# Patient Record
Sex: Male | Born: 1955 | Race: White | Hispanic: No | Marital: Married | Smoking: Former smoker
Health system: Southern US, Community
[De-identification: ages and names within clinical notes are randomized; demographics above are authoritative.]

## PROBLEM LIST (undated history)

## (undated) DIAGNOSIS — I255 Ischemic cardiomyopathy: Secondary | ICD-10-CM

## (undated) DIAGNOSIS — I4891 Unspecified atrial fibrillation: Secondary | ICD-10-CM

## (undated) DIAGNOSIS — I2109 ST elevation (STEMI) myocardial infarction involving other coronary artery of anterior wall: Secondary | ICD-10-CM

## (undated) DIAGNOSIS — I236 Thrombosis of atrium, auricular appendage, and ventricle as current complications following acute myocardial infarction: Secondary | ICD-10-CM

## (undated) DIAGNOSIS — I2510 Atherosclerotic heart disease of native coronary artery without angina pectoris: Secondary | ICD-10-CM

## (undated) DIAGNOSIS — Z95810 Presence of automatic (implantable) cardiac defibrillator: Secondary | ICD-10-CM

## (undated) HISTORY — DX: ST elevation (STEMI) myocardial infarction involving other coronary artery of anterior wall: I21.09

## (undated) HISTORY — PX: BACK SURGERY: SHX140

## (undated) HISTORY — DX: Presence of automatic (implantable) cardiac defibrillator: Z95.810

## (undated) HISTORY — PX: SHOULDER SURGERY: SHX246

## (undated) HISTORY — PX: VASECTOMY: SHX75

## (undated) HISTORY — PX: KNEE SURGERY: SHX244

## (undated) HISTORY — DX: Thrombosis of atrium, auricular appendage, and ventricle as current complications following acute myocardial infarction: I23.6

## (undated) HISTORY — DX: Ischemic cardiomyopathy: I25.5

## (undated) HISTORY — DX: Unspecified atrial fibrillation: I48.91

## (undated) HISTORY — PX: CARDIAC DEFIBRILLATOR PLACEMENT: SHX171

## (undated) HISTORY — DX: Atherosclerotic heart disease of native coronary artery without angina pectoris: I25.10

---

## 2009-01-22 ENCOUNTER — Ambulatory Visit: Payer: Self-pay | Admitting: Cardiovascular Disease

## 2009-01-22 ENCOUNTER — Inpatient Hospital Stay (HOSPITAL_COMMUNITY): Admission: RE | Admit: 2009-01-22 | Discharge: 2009-01-25 | Payer: Self-pay | Admitting: Cardiology

## 2009-01-23 ENCOUNTER — Encounter: Payer: Self-pay | Admitting: Cardiovascular Disease

## 2009-01-29 ENCOUNTER — Ambulatory Visit: Payer: Self-pay | Admitting: Internal Medicine

## 2009-01-29 ENCOUNTER — Encounter: Payer: Self-pay | Admitting: Cardiovascular Disease

## 2009-01-29 LAB — CONVERTED CEMR LAB

## 2009-02-01 ENCOUNTER — Ambulatory Visit: Payer: Self-pay | Admitting: Internal Medicine

## 2009-02-01 LAB — CONVERTED CEMR LAB: POC INR: 2.7

## 2009-02-06 DIAGNOSIS — E785 Hyperlipidemia, unspecified: Secondary | ICD-10-CM

## 2009-02-06 DIAGNOSIS — I4891 Unspecified atrial fibrillation: Secondary | ICD-10-CM | POA: Insufficient documentation

## 2009-02-06 DIAGNOSIS — F17200 Nicotine dependence, unspecified, uncomplicated: Secondary | ICD-10-CM

## 2009-02-06 DIAGNOSIS — I2510 Atherosclerotic heart disease of native coronary artery without angina pectoris: Secondary | ICD-10-CM

## 2009-02-06 DIAGNOSIS — I2109 ST elevation (STEMI) myocardial infarction involving other coronary artery of anterior wall: Secondary | ICD-10-CM

## 2009-02-06 DIAGNOSIS — I2589 Other forms of chronic ischemic heart disease: Secondary | ICD-10-CM

## 2009-02-06 HISTORY — DX: Hyperlipidemia, unspecified: E78.5

## 2009-02-06 HISTORY — DX: Other forms of chronic ischemic heart disease: I25.89

## 2009-02-07 ENCOUNTER — Ambulatory Visit: Payer: Self-pay | Admitting: Cardiovascular Disease

## 2009-02-07 DIAGNOSIS — I238 Other current complications following acute myocardial infarction: Secondary | ICD-10-CM

## 2009-02-07 HISTORY — DX: Other current complications following acute myocardial infarction: I23.8

## 2009-02-07 LAB — CONVERTED CEMR LAB: POC INR: 3.7

## 2009-02-19 ENCOUNTER — Ambulatory Visit: Payer: Self-pay | Admitting: Cardiology

## 2009-03-01 ENCOUNTER — Ambulatory Visit: Payer: Self-pay | Admitting: Cardiology

## 2009-03-22 ENCOUNTER — Ambulatory Visit: Payer: Self-pay | Admitting: Cardiovascular Disease

## 2009-03-22 LAB — CONVERTED CEMR LAB: POC INR: 2

## 2009-04-12 ENCOUNTER — Ambulatory Visit: Payer: Self-pay | Admitting: Cardiovascular Disease

## 2009-04-12 LAB — CONVERTED CEMR LAB: POC INR: 2

## 2009-04-19 ENCOUNTER — Encounter: Payer: Self-pay | Admitting: Cardiovascular Disease

## 2009-04-23 ENCOUNTER — Ambulatory Visit: Payer: Self-pay

## 2009-04-23 ENCOUNTER — Ambulatory Visit: Payer: Self-pay | Admitting: Internal Medicine

## 2009-04-23 ENCOUNTER — Encounter: Payer: Self-pay | Admitting: Cardiovascular Disease

## 2009-04-23 ENCOUNTER — Ambulatory Visit (HOSPITAL_COMMUNITY): Admission: RE | Admit: 2009-04-23 | Discharge: 2009-04-23 | Payer: Self-pay | Admitting: Cardiovascular Disease

## 2009-05-03 ENCOUNTER — Ambulatory Visit: Payer: Self-pay | Admitting: Cardiology

## 2009-05-17 ENCOUNTER — Ambulatory Visit: Payer: Self-pay | Admitting: Cardiology

## 2009-06-07 ENCOUNTER — Ambulatory Visit: Payer: Self-pay | Admitting: Internal Medicine

## 2009-06-08 ENCOUNTER — Encounter (INDEPENDENT_AMBULATORY_CARE_PROVIDER_SITE_OTHER): Payer: Self-pay | Admitting: *Deleted

## 2009-07-05 ENCOUNTER — Ambulatory Visit: Payer: Self-pay | Admitting: Internal Medicine

## 2009-07-05 ENCOUNTER — Ambulatory Visit: Payer: Self-pay | Admitting: Cardiovascular Disease

## 2009-07-05 LAB — CONVERTED CEMR LAB
CO2: 28 meq/L (ref 19–32)
Chloride: 104 meq/L (ref 96–112)
Creatinine, Ser: 0.8 mg/dL (ref 0.4–1.5)
POC INR: 3.1
Sodium: 138 meq/L (ref 135–145)

## 2009-07-13 ENCOUNTER — Telehealth (INDEPENDENT_AMBULATORY_CARE_PROVIDER_SITE_OTHER): Payer: Self-pay | Admitting: *Deleted

## 2009-07-16 ENCOUNTER — Ambulatory Visit: Payer: Self-pay | Admitting: Cardiovascular Disease

## 2009-07-16 ENCOUNTER — Ambulatory Visit (HOSPITAL_COMMUNITY): Admission: RE | Admit: 2009-07-16 | Discharge: 2009-07-16 | Payer: Self-pay | Admitting: Cardiovascular Disease

## 2009-07-18 ENCOUNTER — Telehealth: Payer: Self-pay | Admitting: Cardiovascular Disease

## 2009-07-19 ENCOUNTER — Ambulatory Visit: Payer: Self-pay | Admitting: Cardiovascular Disease

## 2009-07-19 LAB — CONVERTED CEMR LAB
BUN: 17 mg/dL (ref 6–23)
Calcium: 9.7 mg/dL (ref 8.4–10.5)
Chloride: 110 meq/L (ref 96–112)
Creatinine, Ser: 0.8 mg/dL (ref 0.4–1.5)
GFR calc non Af Amer: 106.87 mL/min (ref >60)
Glucose, Bld: 121 mg/dL — ABNORMAL HIGH (ref 70–99)
Sodium: 141 meq/L (ref 135–145)

## 2009-07-20 ENCOUNTER — Telehealth: Payer: Self-pay | Admitting: Cardiovascular Disease

## 2009-07-20 ENCOUNTER — Encounter: Payer: Self-pay | Admitting: Cardiology

## 2009-07-20 ENCOUNTER — Ambulatory Visit: Payer: Self-pay | Admitting: Cardiovascular Disease

## 2009-07-20 LAB — CONVERTED CEMR LAB
Basophils Relative: 0.5 % (ref 0.0–3.0)
HCT: 41.6 % (ref 39.0–52.0)
Monocytes Absolute: 0.5 10*3/uL (ref 0.1–1.0)
Monocytes Relative: 7.8 % (ref 3.0–12.0)
Neutro Abs: 4.5 10*3/uL (ref 1.4–7.7)
Nitrite: NEGATIVE
Platelets: 223 10*3/uL (ref 150.0–400.0)
Prothrombin Time: 26 s — ABNORMAL HIGH (ref 9.7–11.8)
RBC: 4.65 M/uL (ref 4.22–5.81)
RDW: 13.1 % (ref 11.5–14.6)
pH: 5.5 (ref 5.0–8.0)

## 2009-07-31 ENCOUNTER — Ambulatory Visit: Payer: Self-pay | Admitting: Cardiovascular Disease

## 2009-08-09 ENCOUNTER — Ambulatory Visit: Payer: Self-pay | Admitting: Internal Medicine

## 2009-08-09 LAB — CONVERTED CEMR LAB: POC INR: 4.1

## 2009-08-23 ENCOUNTER — Ambulatory Visit: Payer: Self-pay | Admitting: Cardiology

## 2009-08-23 LAB — CONVERTED CEMR LAB: POC INR: 2.7

## 2009-09-06 ENCOUNTER — Ambulatory Visit: Payer: Self-pay | Admitting: Internal Medicine

## 2009-09-06 ENCOUNTER — Ambulatory Visit: Payer: Self-pay | Admitting: Cardiovascular Disease

## 2009-09-10 ENCOUNTER — Telehealth: Payer: Self-pay | Admitting: Internal Medicine

## 2009-09-12 ENCOUNTER — Encounter: Payer: Self-pay | Admitting: Internal Medicine

## 2009-09-13 ENCOUNTER — Ambulatory Visit: Payer: Self-pay | Admitting: Cardiovascular Disease

## 2009-09-19 ENCOUNTER — Encounter: Payer: Self-pay | Admitting: Internal Medicine

## 2009-09-21 ENCOUNTER — Ambulatory Visit: Payer: Self-pay | Admitting: Internal Medicine

## 2009-09-24 LAB — CONVERTED CEMR LAB
BUN: 17 mg/dL (ref 6–23)
Basophils Absolute: 0 10*3/uL (ref 0.0–0.1)
Basophils Relative: 0.6 % (ref 0.0–3.0)
CO2: 26 meq/L (ref 19–32)
Hemoglobin: 13.1 g/dL (ref 13.0–17.0)
MCHC: 34.4 g/dL (ref 30.0–36.0)
MCV: 91 fL (ref 78.0–100.0)
Neutro Abs: 3.1 10*3/uL (ref 1.4–7.7)
RBC: 4.18 M/uL — ABNORMAL LOW (ref 4.22–5.81)
RDW: 13.9 % (ref 11.5–14.6)
Sodium: 141 meq/L (ref 135–145)
WBC: 4.9 10*3/uL (ref 4.5–10.5)

## 2009-09-26 LAB — CONVERTED CEMR LAB
Eosinophils Relative: 1.9 % (ref 0.0–5.0)
Lymphocytes Relative: 28 % (ref 12.0–46.0)
Lymphs Abs: 1.6 10*3/uL (ref 0.7–4.0)
MCHC: 34.3 g/dL (ref 30.0–36.0)
MCV: 90.4 fL (ref 78.0–100.0)
Monocytes Absolute: 0.6 10*3/uL (ref 0.1–1.0)
Platelets: 213 10*3/uL (ref 150.0–400.0)

## 2009-09-28 ENCOUNTER — Ambulatory Visit: Payer: Self-pay | Admitting: Internal Medicine

## 2009-09-28 ENCOUNTER — Ambulatory Visit (HOSPITAL_COMMUNITY)
Admission: RE | Admit: 2009-09-28 | Discharge: 2009-09-29 | Payer: Self-pay | Source: Home / Self Care | Admitting: Internal Medicine

## 2009-10-04 ENCOUNTER — Encounter: Payer: Self-pay | Admitting: Internal Medicine

## 2009-10-12 ENCOUNTER — Ambulatory Visit: Payer: Self-pay

## 2009-10-12 ENCOUNTER — Encounter: Payer: Self-pay | Admitting: Internal Medicine

## 2009-10-17 ENCOUNTER — Ambulatory Visit: Payer: Self-pay | Admitting: Cardiovascular Disease

## 2009-10-17 LAB — CONVERTED CEMR LAB
ALT: 34 units/L (ref 0–53)
Bilirubin, Direct: 0.2 mg/dL (ref 0.0–0.3)
LDL Cholesterol: 86 mg/dL (ref 0–99)
Triglycerides: 125 mg/dL (ref 0.0–149.0)

## 2009-10-25 ENCOUNTER — Telehealth: Payer: Self-pay | Admitting: Cardiovascular Disease

## 2009-10-26 ENCOUNTER — Encounter: Payer: Self-pay | Admitting: Internal Medicine

## 2009-10-30 ENCOUNTER — Telehealth: Payer: Self-pay | Admitting: Cardiovascular Disease

## 2009-12-28 ENCOUNTER — Telehealth (INDEPENDENT_AMBULATORY_CARE_PROVIDER_SITE_OTHER): Payer: Self-pay | Admitting: *Deleted

## 2010-01-01 ENCOUNTER — Ambulatory Visit: Payer: Self-pay | Admitting: Internal Medicine

## 2010-01-01 DIAGNOSIS — Z95810 Presence of automatic (implantable) cardiac defibrillator: Secondary | ICD-10-CM

## 2010-01-01 HISTORY — DX: Presence of automatic (implantable) cardiac defibrillator: Z95.810

## 2010-01-08 ENCOUNTER — Ambulatory Visit: Payer: Self-pay | Admitting: Cardiovascular Disease

## 2010-02-01 ENCOUNTER — Telehealth: Payer: Self-pay | Admitting: Cardiovascular Disease

## 2010-02-07 ENCOUNTER — Encounter: Payer: Self-pay | Admitting: Cardiovascular Disease

## 2010-02-27 ENCOUNTER — Telehealth: Payer: Self-pay | Admitting: Cardiovascular Disease

## 2010-03-05 NOTE — Progress Notes (Signed)
Summary: MRI ORDER  Phone Note From Other Clinic   Caller: Nurse [phone] Request: Talk with Nurse Summary of Call: PT HAS MRI SCHEDULED ON MONDAY AT [facility]-NEED AN ORDER Initial call taken by: Glynda Jaeger,  July 13, 2009 3:26 PM  Follow-up for Phone Call        order done and sent Dennis Bast, RN, BSN  July 16, 2009 3:22 PM

## 2010-03-05 NOTE — Progress Notes (Signed)
Summary: urine change colors after MRI  Phone Note Call from Patient Call back at [phone] or cell# [phone]   Caller: Patient Reason for Call: Talk to Nurse, Talk to Doctor Summary of Call: pt is concerned because his urine is brownish red and had an MRI done Monday and he wanted to make sure it was the dye that they injected or is it something he should be concerned about Initial call taken by: Omer Jack,  July 18, 2009 12:32 PM  Follow-up for Phone Call        Spoke with pt. Patient states he had a cardiac MRI on monday. On tuesday pt. states is urinating dark amber urine. He would like to know if it is the contrast  Dye  comming out. Pt. states had not have a dye reaction before. I let pt. know the dark amber urine could be something other than the dye. Advice to  drink some water to see it it helps. Also to call his PCP. Pt. states has an appointment with Dr. Clifton James tomorrow. He will see what he  recommends  then. Follow-up by: Ollen Gross, RN, BSN,  July 18, 2009 12:49 PM  Additional Follow-up for Phone Call Additional follow up Details #1::        Pt. had office visit with Dr. Clifton James today. UA and BMP done Additional Follow-up by: Dossie Arbour, RN, BSN,  July 19, 2009 3:54 PM

## 2010-03-05 NOTE — Letter (Signed)
Summary: [facility]   Imported By: Marylou Mccoy 03/22/2009 09:05:39  _____________________________________________________________________  External Attachment:    Type:   Image     Comment:   External Document

## 2010-03-05 NOTE — Medication Information (Signed)
Summary: rov/eac  Anticoagulant Therapy  Managed by: Weston Brass, PharmD Referring MD: Clifton James MD, Cristal Deer PCP: No PCP at this time Supervising MD: Jens Som MD, Arlys John Indication 1: Atrial Fibrillation Indication 2: Acute MI Lab Used: [facility] [place] Site: [address] INR POC 2.9 INR RANGE 2.0-3.0  Dietary changes: yes       Details: decreased leafy green vegetables over the past 2 weeks   Health status changes: no    Bleeding/hemorrhagic complications: no    Recent/future hospitalizations: no    Any changes in medication regimen? no    Recent/future dental: no  Any missed doses?: no       Is patient compliant with meds? yes       Allergies: No Known Drug Allergies  Anticoagulation Management History:      The patient is taking warfarin and comes in today for a routine follow up visit.  Negative risk factors for bleeding include an age less than 39 years old.  The bleeding index is 'low risk'.  Negative CHADS2 values include Age > 40 years old.  Anticoagulation responsible provider: Jens Som MD, Arlys John.  INR POC: 2.9.  Cuvette Lot#: [id number].  Exp: 06/2010.    Anticoagulation Management Assessment/Plan:      The patient's current anticoagulation dose is Warfarin sodium 5 mg tabs: Use as directed by Anticoagulation Clinic.  The target INR is 2.0-3.0.  The next INR is due 06/07/2009.  Anticoagulation instructions were given to patient.  Results were reviewed/authorized by Weston Brass, PharmD.  He was notified by Weston Brass PharmD.         Prior Anticoagulation Instructions: INR 1.6  Start NEW dosing schedule of 1/2 tablet on Tuesday and Saturday, and take 1 tablet all other days.  Return to clinic in 2 weeks.   Current Anticoagulation Instructions: INR 2.9  Continue same dose of 1 tablet every day except 1/2 tablet on Tuesday and Saturday.  Try to increase you leafy green vegetables to 1-2 servings/week

## 2010-03-05 NOTE — Progress Notes (Signed)
Summary: Cardiology Phone Note - Medicine Question  Phone Note Call from Patient   Caller: Spouse Summary of Call: Pt's wife called. Jeremy Boyle has developed a head cold with runny nose/sinus type headache and she is wondering what is safe to give him over-the-counter for his cold given his other medicines. Prior labs reviewed from 10/2009 - normal LFTs/renal function. I advised Tylenol as needed/as directed by specific brand, and avoid products with pseudoephedrine (i.e. Sudafed that must be bought at pharmacy counter) and may use products with phenylephrine instead short-term (i.e. Sudafed PE). Also advised to avoid frequent dosing of ibuprofen given Effient/ASA, but may take a dose today if needed. Advised f/up with PCP if not improved or has any other concerns. Wife expressed understanding and gratitude. Initial call taken by: Ronie Spies PA-C

## 2010-03-05 NOTE — Cardiovascular Report (Signed)
Summary: Office Visit   Office Visit   Imported By: Roderic Ovens 10/30/2009 15:23:27  _____________________________________________________________________  External Attachment:    Type:   Image     Comment:   External Document

## 2010-03-05 NOTE — Cardiovascular Report (Signed)
Summary: Office Visit   Office Visit   Imported By: Roderic Ovens 01/08/2010 11:05:06  _____________________________________________________________________  External Attachment:    Type:   Image     Comment:   External Document

## 2010-03-05 NOTE — Medication Information (Signed)
Summary: rov/eac  Anticoagulant Therapy  Managed by: Eda Keys, PharmD Referring MD: Clifton James MD, Cristal Deer PCP: No PCP at this time Supervising MD: Jens Som MD, Arlys John Indication 1: Atrial Fibrillation Indication 2: Acute MI Lab Used: [facility] [place] Site: [address] INR POC 1.6 INR RANGE 2.0-3.0  Dietary changes: no    Health status changes: no    Bleeding/hemorrhagic complications: no    Recent/future hospitalizations: no    Any changes in medication regimen? no    Recent/future dental: no  Any missed doses?: no       Is patient compliant with meds? yes       Allergies: No Known Drug Allergies  Anticoagulation Management History:      Negative risk factors for bleeding include an age less than 44 years old.  The bleeding index is 'low risk'.  Negative CHADS2 values include Age > 67 years old.  Anticoagulation responsible provider: Jens Som MD, Arlys John.  INR POC: 1.6.  Exp: 06/2010.    Anticoagulation Management Assessment/Plan:      The patient's current anticoagulation dose is Warfarin sodium 5 mg tabs: Use as directed by Anticoagulation Clinic.  The target INR is 2.0-3.0.  The next INR is due 05/17/2009.  Anticoagulation instructions were given to patient.  Results were reviewed/authorized by Eda Keys, PharmD.  He was notified by Eda Keys.         Prior Anticoagulation Instructions: INR 2.0  Start NEW dosing schedule of 1/2 tablet on Tuesday, Thursday, and Saturday, and 1 tablet all other days.  Return to clinic in 3 weeks.    Current Anticoagulation Instructions: INR 1.6  Start NEW dosing schedule of 1/2 tablet on Tuesday and Saturday, and take 1 tablet all other days.  Return to clinic in 2 weeks.

## 2010-03-05 NOTE — Assessment & Plan Note (Signed)
Summary: eph/per Kathryn/jss   Visit Type:  Follow-up Primary Provider:  No PCP at this time  CC:  pt had recent MI...states today no cardiac complaints .  History of Present Illness: 55 yo WM with history of tobacco abuse, prior CAD who was admitted 01/22/09 with an acute anterior STEMI with thrombosis of the mid LAD stent placed in 2005. I placed a drug eluting stent in the mid LAD. He has done well since discharge. He is here today for follow up. He has had no chest pain, SOB, near syncope  or syncope. He has noticed a few episodes of skipped beats. No sustained palpitations. He has returned to work. He stopped smoking after his   After his MI, his hospitalization was complicated by atrial fibrillation. He was in sinus rhythm at the time of discharge from the hospital. He was also found to have severe hypokinesis of the anterior wall on echo and a possible LV thrombus. He was started on coumadin with Lovenox bridge at discharge. He has been following in our coumadin clinic.   Current Medications (verified): 1)  Lisinopril 2.5 Mg Tabs (Lisinopril) .... Take One Tablet By Mouth Daily 2)  Metoprolol Tartrate 50 Mg Tabs (Metoprolol Tartrate) .... Take One Tablet By Mouth Twice A Day 3)  Effient 10 Mg Tabs (Prasugrel Hcl) .... Take One Tablet By Mouth Daily 4)  Warfarin Sodium 5 Mg Tabs (Warfarin Sodium) .... Use As Directed By Anticoagulation Clinic 5)  Aspirin Ec 325 Mg Tbec (Aspirin) .... Take One Tablet By Mouth Daily 6)  Simvastatin 80 Mg Tabs (Simvastatin) .... Take One Tablet By Mouth Daily At Bedtime  Allergies (verified): No Known Drug Allergies  Past History:  Past Medical History: TOBACCO USER (ICD-305.1)-stopped last month COUMADIN THERAPY (ICD-V58.61) ATRIAL FIBRILLATION (ICD-427.31) CARDIOMYOPATHY, ISCHEMIC (ICD-414.8) diagnosed 12/10. CAD, NATIVE VESSEL (ICD-414.01)-s/p drug eluting stent LAD 12/10 MYOCARDIAL INFARCTION, ACUTE, ANTERIOR WALL (ICD-410.10) 12/10    Past  Surgical History: Reviewed history from 02/06/2009 and no changes required.  1. Back surgery.   2. Vasectomy.   3. Knee surgery.   4. Shoulder surgery.      Family History: Reviewed history from 02/06/2009 and no changes required.  There is a strong family history of coronary artery   disease.  Mother alive, CAD s/p CABG, DM Father deceased 20 MI  Social History: Reviewed history from 02/06/2009 and no changes required. The patient smoked 1 pack of cigarettes per day for the   last 12-13 years. Stopped smoking 12/10.  He admits to social alcohol use, but denies any   illicit drug use.   He is married with 2 children.   Review of Systems       The patient complains of palpitations.  The patient denies fatigue, malaise, fever, weight gain/loss, vision loss, decreased hearing, hoarseness, chest pain, shortness of breath, prolonged cough, wheezing, sleep apnea, coughing up blood, abdominal pain, blood in stool, nausea, vomiting, diarrhea, heartburn, incontinence, blood in urine, muscle weakness, joint pain, leg swelling, rash, skin lesions, headache, fainting, dizziness, depression, anxiety, enlarged lymph nodes, easy bruising or bleeding, and environmental allergies.    Vital Signs:  Patient profile:   55 year old male Height:      71 inches Weight:      195 pounds BMI:     27.30 Pulse rate:   53 / minute Pulse rhythm:   irregular BP sitting:   98 / 70  (left arm) Cuff size:   large  Vitals Entered By: Okey Regal  Denyse Amass, CMA (February 07, 2009 11:35 AM)  Physical Exam  General:  General: Well developed, well nourished, NAD HEENT: OP clear, mucus membranes moist SKIN: warm, dry Neuro: No focal deficits Musculoskeletal: Muscle strength 5/5 all ext Psychiatric: Mood and affect normal Neck: No JVD, no carotid bruits, no thyromegaly, no lymphadenopathy. Lungs:Clear bilaterally, no wheezes, rhonci, crackles CV: RRR no murmurs, gallops rubs Abdomen: soft, NT, ND, BS  present Extremities: No edema, pulses 2+.    EKG  Procedure date:  02/07/2009  Findings:      Sinus bradycardia, rate 53 bpm. LAD. Old anterior infarct. Non-specific T wave abnormalities.   Cardiac Cath  Procedure date:  01/22/2009  Findings:      1. The left main coronary artery had an ostial 30% stenosis. 2. The left anterior descending is a large vessel that coursed to the     apex.  There was a 100% occlusion in the midportion of the vessel     at the site of a prior stent.  First diagonal was a large     bifurcating vessel that had minimal plaque disease only.  The     second diagonal was moderate sized with ostial 40% stenosis. 3. Circumflex vessel had an ostial 20% stenosis. 4. The right coronary artery was a large dominant vessel that had 30%     mid stenosis. 5. Left ventricular angiogram was performed in the RAO projection and     showed anteroapical and apical akinesis.  Ejection fraction was     estimated at 30-35%.   IMPRESSION: 1. Acute anterior ST-elevation myocardial infarction. 2. Single-vessel coronary artery disease. 3. Thrombectomy followed by percutaneous transluminal coronary     angioplasty and placement of a drug-eluting stent in the mid left     anterior descending. 4. Segmental left ventricular dysfunction.  Echocardiogram  Procedure date:  01/23/2009  Findings:      LV cavity size normal wall, thickness was increased in the pattern of mild LVH, systolic function was moderately reduced, EF was 40-45%.  There was akinesis at the apical myocardium.  There was akinesis of the mid distal anteroseptal myocardium.  Mitral valve, mitral regurg.  Left atrium was mildly dilated. Possible LV apical thrombus.  Impression & Recommendations:  Problem # 1:  CAD, NATIVE VESSEL (ICD-414.01) s/p anterior STEMI with very late stent thrombosis, now with drug eluting stent. Continue ASA and Effient for at least one year. Continue beta blocker, Ace-inhibitor  and statin.   His updated medication list for this problem includes:    Lisinopril 2.5 Mg Tabs (Lisinopril) .Marland Kitchen... Take one tablet by mouth daily    Metoprolol Tartrate 50 Mg Tabs (Metoprolol tartrate) .Marland Kitchen... Take one tablet by mouth twice a day    Effient 10 Mg Tabs (Prasugrel hcl) .Marland Kitchen... Take one tablet by mouth daily    Warfarin Sodium 5 Mg Tabs (Warfarin sodium) ..... Use as directed by anticoagulation clinic    Aspirin Ec 325 Mg Tbec (Aspirin) .Marland Kitchen... Take one tablet by mouth daily  Orders: EKG w/ Interpretation (93000) Echocardiogram (Echo)  Problem # 2:  CARDIOMYOPATHY, ISCHEMIC (ICD-414.8) Volume status ok. Continue current meds.   His updated medication list for this problem includes:    Lisinopril 2.5 Mg Tabs (Lisinopril) .Marland Kitchen... Take one tablet by mouth daily    Metoprolol Tartrate 50 Mg Tabs (Metoprolol tartrate) .Marland Kitchen... Take one tablet by mouth twice a day    Effient 10 Mg Tabs (Prasugrel hcl) .Marland Kitchen... Take one tablet by mouth daily  Warfarin Sodium 5 Mg Tabs (Warfarin sodium) ..... Use as directed by anticoagulation clinic    Aspirin Ec 325 Mg Tbec (Aspirin) .Marland Kitchen... 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 If no evidence of LV thrombus, will stop coumadin at that time.   Problem # 4:  TOBACCO USER (ICD-305.1) Pt stopped smoking and he is congratulated on this.   Problem # 5:  ATRIAL FIBRILLATION (ICD-427.31) No clear recurrence. If pt has more episodes of palpitations that are sustained, will have him wear a Holter monitor.   His updated medication list for this problem includes:    Metoprolol Tartrate 50 Mg Tabs (Metoprolol tartrate) .Marland Kitchen... Take one tablet by mouth twice a day    Effient 10 Mg Tabs (Prasugrel hcl) .Marland Kitchen... Take one tablet by mouth daily    Warfarin Sodium 5 Mg Tabs (Warfarin sodium) ..... Use as directed by anticoagulation clinic    Aspirin Ec 325 Mg Tbec  (Aspirin) .Marland Kitchen... Take one tablet by mouth daily  Patient Instructions: 1)  Your physician recommends that you schedule a follow-up appointment in: 3 months (week or so after echo) 2)  Your physician has requested that you have an echocardiogram.  Echocardiography is a painless test that uses sound waves to create images of your heart. It provides your doctor with information about the size and shape of your heart and how well your heart's chambers and valves are working.  This procedure takes approximately one hour. There are no restrictions for this procedure.  To be done in 10-11 weeks

## 2010-03-05 NOTE — Medication Information (Signed)
Summary: Coumadin Clinic  Anticoagulant Therapy  Managed by: Inactive Referring MD: Clifton James MD, Cristal Deer PCP: No PCP at this time Supervising MD: Tenny Craw MD, Gunnar Fusi Indication 1: Atrial Fibrillation Indication 2: Acute MI Lab Used: [facility] [place] Site: [address] INR RANGE 2.0-3.0          Comments: Per hospital discharge 09/30/09 pt off Coumadin and on Pradaxa. Cloyde Reams RN  October 26, 2009 10:13 AM   Allergies: No Known Drug Allergies  Anticoagulation Management History:      Negative risk factors for bleeding include an age less than 31 years old.  The bleeding index is 'low risk'.  Negative CHADS2 values include Age > 23 years old.  His last INR was 1.1 ratio.  Anticoagulation responsible provider: Tenny Craw MD, Gunnar Fusi.  Exp: 11/2010.    Anticoagulation Management Assessment/Plan:      The target INR is 2.0-3.0.  The next INR is due 09/06/2009.  Anticoagulation instructions were given to patient.  Results were reviewed/authorized by Inactive.         Prior Anticoagulation Instructions: INR 1.9 Start Pradaxa 150mg  twice a day every 12 hrs apart. Start first dose today. Lab work 09/13/09  Appended Document: Coumadin Clinic Spoke to pt about disability. He says he feels well but has no energy. I offered him an appt to evaluate if he desires. He will get paperwork for disability and bring by office. cdm

## 2010-03-05 NOTE — Procedures (Signed)
Summary: wound check   Current Medications (verified): 1)  Lisinopril 2.5 Mg Tabs (Lisinopril) .... Take One Tablet By Mouth Daily 2)  Metoprolol Tartrate 50 Mg Tabs (Metoprolol Tartrate) .... Take One Tablet By Mouth Twice A Day 3)  Effient 10 Mg Tabs (Prasugrel Hcl) .... Take One Tablet By Mouth Daily 4)  Aspirin Ec 325 Mg Tbec (Aspirin) .... Take One Tablet By Mouth Daily 5)  Crestor 20 Mg Tabs (Rosuvastatin Calcium) .... Take One Tablet By Mouth Daily. 6)  Nitrostat 0.4 Mg Subl (Nitroglycerin) .Marland Kitchen.. 1 Tablet Under Tongue At Onset of Chest Pain; You May Repeat Every 5 Minutes For Up To 3 Doses. 7)  Pradaxa 150 Mg Caps (Dabigatran Etexilate Mesylate) .... Take 1 Capsule Two Times A Day  Allergies (verified): No Known Drug Allergies   ICD Specifications Following MD:  Lewayne Bunting, MD     ICD Vendor:  [employer]     ICD Model Number:  [id number]     ICD Serial Number:  [id number] ICD DOI:  09/28/2009     ICD Implanting MD:  Lewayne Bunting, MD  Lead 1:    Location: RV     DOI: 09/28/2009     Model #: 8119     Serial #: [id number]     Status: active  Indications::  ICM   ICD Follow Up Remote Check?  No Charge Time:  7.8 seconds     Battery Est. Longevity:  8.2 years Underlying rhythm:  SR ICD Dependent:  No       ICD Device Measurements Right Ventricle:  Amplitude: 12 mV, Impedance: 600 ohms, Threshold: 0.75 V at 0.5 msec Shock Impedance: 51 ohms   Episodes Coumadin:  No Shock:  0     ATP:  0     Nonsustained:  0     Ventricular Pacing:  <1%  Brady Parameters Mode VVI     Lower Rate Limit:  40      Tachy Zones VF:  187     Next Cardiology Appt Due:  01/01/2010 Tech Comments:  No parameter changes.  Device function normal.  Steri strips removed by the patients.  The wound is well healed.  ROV 11/29 with Dr. Ladona Ridgel. Altha Harm, LPN  October 12, 2009 3:36 PM

## 2010-03-05 NOTE — Miscellaneous (Signed)
Summary: Orders Update  Clinical Lists Changes  Orders: Added new Test order of TLB-Udip ONLY ([id number]) - Signed 

## 2010-03-05 NOTE — Cardiovascular Report (Signed)
Summary: Pre Op Orders  Pre Op Orders   Imported By: Roderic Ovens 09/24/2009 11:08:34  _____________________________________________________________________  External Attachment:    Type:   Image     Comment:   External Document

## 2010-03-05 NOTE — Letter (Signed)
Summary: Financial [employer]  [employer]   Imported By: Roderic Ovens 05/07/2009 16:31:52  _____________________________________________________________________  External Attachment:    Type:   Image     Comment:   External Document

## 2010-03-05 NOTE — Assessment & Plan Note (Signed)
Summary: elev. for poss device/saf   Visit Type:  Follow-up Primary Lyberti Thrush:  No PCP at this time   History of Present Illness: [name] is referred today by Dr. Sanjuana Kava for consideration for prophylactic ICD implant.  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  He re-occluded his stent in December of 2010 and underwent repeat stent implantation to the mid LAD.  He has done well since then.  He has class 2 CHF despite maximal medical therapy and his EF by MRI was 35%.  He has never had syncope though he does have some orthostatic symptoms. In addition, he has occaisional palpitations and was noted to have PAF during his recent MI. He is on anticoagulation with Pradaxa.   He denies peripheral edema and has had no c/p.    Current Medications (verified): 1)  Lisinopril 2.5 Mg Tabs (Lisinopril) .... Take One Tablet By Mouth Daily 2)  Metoprolol Tartrate 50 Mg Tabs (Metoprolol Tartrate) .... Take One Tablet By Mouth Twice A Day 3)  Effient 10 Mg Tabs (Prasugrel Hcl) .... Take One Tablet By Mouth Daily 4)  Aspirin Ec 325 Mg Tbec (Aspirin) .... Take One Tablet By Mouth Daily 5)  Crestor 20 Mg Tabs (Rosuvastatin Calcium) .... Take One Tablet By Mouth Daily. 6)  Nitrostat 0.4 Mg Subl (Nitroglycerin) .Marland Kitchen.. 1 Tablet Under Tongue At Onset of Chest Pain; You May Repeat Every 5 Minutes For Up To 3 Doses. 7)  Pradaxa 150 Mg Caps (Dabigatran Etexilate Mesylate) .... Take 1 Capsule Two Times A Day  Allergies (verified): No Known Drug Allergies  Past History:  Past Medical History: Last updated: 07/19/2009 TOBACCO USER (ICD-305.1)-stopped last month COUMADIN THERAPY (ICD-V58.61) ATRIAL FIBRILLATION (ICD-427.31) CARDIOMYOPATHY, ISCHEMIC (ICD-414.8) diagnosed 12/10. 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 Back surgery.   2. Vasectomy.   3. Knee surgery.   4. Shoulder surgery.      Family History: Last updated: 02/07/2009  There is a strong family history of coronary artery   disease.  Mother alive, CAD s/p CABG, DM Father deceased 59 MI  Social History: Last updated: 02/07/2009 The patient smoked 1 pack of cigarettes per day for the   last 12-13 years. Stopped smoking 12/10.  He admits to social alcohol use, but denies any   illicit drug use.   He is married with 2 children.   Review of Systems       All systems reviewed and negative except as noted in the HPI except for a h/o hematuria and rare palpitations.  Vital Signs:  Patient profile:   55 year old male Height:      71 inches Weight:      202 pounds BMI:     28.28 Pulse rate:   66 / minute BP sitting:   130 / 80  (left arm)  Vitals Entered By: Laurance Flatten CMA (September 06, 2009 1:44 PM)  Physical Exam  General:  General: Well developed, well nourished, NAD HEENT: OP clear, mucus membranes moist SKIN: warm, dry Neuro: No focal deficits Musculoskeletal: Muscle strength 5/5 all ext Psychiatric: Mood and affect normal Neck: No JVD, no carotid bruits, no thyromegaly, no lymphadenopathy. Lungs:Clear bilaterally, no wheezes, rhonci, crackles CV: RRR no murmurs, gallops rubs Abdomen: soft, NT, ND, BS present Extremities: No edema, pulses  2+.    EKG  Procedure date:  09/06/2009  Findings:      Normal sinus rhythm with rate of: 66. Left axis deviation.    Impression & Recommendations:  Problem # 1:  CARDIOMYOPATHY, ISCHEMIC (ICD-414.8) He denies anginal symptoms.  He will continue his current meds. I have discussed the treatment options with regard to prophylactic ICD implant.  He has an indication for ICD implant.  He would like to consider his options for ICD implant and will call us if he decides to proceed with implant. The following medications were removed from the medication  list:    Warfarin Sodium 5 Mg Tabs (Warfarin sodium) ..... Use as directed by anticoagulation clinic His updated medication list for this problem includes:    Lisinopril 2.5 Mg Tabs (Lisinopril) .Marland Kitchen... Take one tablet by mouth daily    Metoprolol Tartrate 50 Mg Tabs (Metoprolol tartrate) .Marland Kitchen... Take one tablet by mouth twice a day    Effient 10 Mg Tabs (Prasugrel hcl) .Marland Kitchen... Take one tablet by mouth daily    Aspirin Ec 325 Mg Tbec (Aspirin) .Marland Kitchen... Take one tablet by mouth daily    Nitrostat 0.4 Mg Subl (Nitroglycerin) .Marland Kitchen... 1 tablet under tongue at onset of chest pain; you may repeat every 5 minutes for up to 3 doses.  Problem # 2:  HYPERLIPIDEMIA (ICD-272.4) He will continue a low fat diet and his current medical therapy. His updated medication list for this problem includes:    Crestor 20 Mg Tabs (Rosuvastatin calcium) .Marland Kitchen... Take one tablet by mouth daily.  Problem # 3:  ATRIAL FIBRILLATION (ICD-427.31) His atrial fibrillation appears to be quiet.  He will continue his current meds. He has switched to pradaxa for prevention of stroke. The following medications were removed from the medication list:    Warfarin Sodium 5 Mg Tabs (Warfarin sodium) ..... Use as directed by anticoagulation clinic His updated medication list for this problem includes:    Metoprolol Tartrate 50 Mg Tabs (Metoprolol tartrate) .Marland Kitchen... Take one tablet by mouth twice a day    Effient 10 Mg Tabs (Prasugrel hcl) .Marland Kitchen... Take one tablet by mouth daily    Aspirin Ec 325 Mg Tbec (Aspirin) .Marland Kitchen... Take one tablet by mouth daily

## 2010-03-05 NOTE — Miscellaneous (Signed)
Summary: Device preload  Clinical Lists Changes  Observations: Added new observation of ICD INDICATN: ICM (10/04/2009 16:44) Added new observation of ICDLEADSTAT1: active (10/04/2009 16:44) Added new observation of ICDLEADSER1: [id number] (10/04/2009 16:44) Added new observation of ICDLEADMOD1: 7121  (10/04/2009 16:44) Added new observation of ICDLEADLOC1: RV  (10/04/2009 16:44) Added new observation of ICDLEADDOI1: 09/28/2009  (10/04/2009 16:44) Added new observation of ICD IMP MD: Lewayne Bunting, MD  (10/04/2009 16:44) Added new observation of ICD IMPL DTE: 09/28/2009  (10/04/2009 16:44) Added new observation of ICD SERL#: [id number]  (10/04/2009 16:44) Added new observation of ICD MODL#: [id number]  (10/04/2009 53:66) Added new observation of ICDMANUFACTR: [employer]  (10/04/2009 16:44) Added new observation of ICD MD: Lewayne Bunting, MD  (10/04/2009 16:44)       ICD Specifications Following MD:  Lewayne Bunting, MD     ICD Vendor:  [employer]     ICD Model Number:  [id number]     ICD Serial Number:  [id number] ICD DOI:  09/28/2009     ICD Implanting MD:  Lewayne Bunting, MD  Lead 1:    Location: RV     DOI: 09/28/2009     Model #: [id number]     Serial #: [id number]     Status: active  Indications::  ICM

## 2010-03-05 NOTE — Letter (Signed)
Summary: Appointment - Cardiac MRI  [facility], Main Office  [address]   [place], [place] [address]   Phone: [phone]  Fax: [phone]      Jun 08, 2009 MRN: [id number]   [facility] [address] [place], [place]  [address]   Dear Mr. BRAILYN DELMAN,   We have scheduled the above patient for an appointment for a Cardiac MRI on 07/16/2009 at 9:00am.  Please refer to the below information for the location and instructions for this test:  Location:     [facility]       [address]       [place], [place]  [address] Instructions:    Arrive at [facility] 45 minutes prior to your appointment time.  This will ensure you are in the Radiology Department 30 minutes prior to your appointment.    There are no restrictions for this test you may eat and take medications as usual.  If you need to reschedule this appointment please call at the number listed above.  Sincerely,  Migdalia Dk [facility] Scheduling Team

## 2010-03-05 NOTE — Progress Notes (Signed)
Summary: wants to have difibulator  Phone Note Call from Patient   Caller: Patient Reason for Call: Talk to Nurse Summary of Call: pt has no dates-needs to be by 8-31-pt wants to have difibultor-pls call [phone] or [phone] after 4p Initial call taken by: Glynda Jaeger,  September 10, 2009 12:38 PM  Follow-up for Phone Call        09/21/09 labs 9am procedure on 09/28/09 pt aware and orders in computer Dennis Bast, RN, BSN  September 19, 2009 4:59 PM

## 2010-03-05 NOTE — Progress Notes (Signed)
Summary: Pt request call  Phone Note Call from Patient Call back at Home Phone [phone] Call back at [phone]   Caller: Patient Summary of Call: Pt request call Initial call taken by: Judie Grieve,  October 25, 2009 9:48 AM  Follow-up for Phone Call        Spoke with Pt. Concerned b/c his new insurance company [employer] is not covering his Pradaxa and Effient . He wants to remain on Pradaxa instead of Coumadin. Advised him that the assitance programs would not work since he has insurance. Also told him to look into the [facility] $4 prescription programs. Will leave him samples at the front. Whitney Maeola Sarah RN  October 25, 2009 11:33 AM

## 2010-03-05 NOTE — Assessment & Plan Note (Signed)
Summary: PER CHECK OUT/SF   Visit Type:  rov Primary Provider:  [facility]  CC:  pt states he has not seen PCP in 2 yrs..says he goes mostly to [facility]...sob w/exertion...denies any cp or edema.  History of Present Illness: 55 yo WM with history of tobacco abuse, prior CAD who was admitted 01/22/09 with an acute anterior STEMI with thrombosis of the mid LAD stent placed in 2005. I placed a drug eluting stent in the mid LAD.  After his MI, his hospitalization was complicated by atrial fibrillation. He was in sinus rhythm at the time of discharge from the hospital. He was also found to have severe hypokinesis of the anterior wall on echo and a possible LV thrombus. He was started on coumadin with Lovenox bridge at discharge. He has been following in our coumadin clinic.  Cardiac MRI this spring with EF of 35% and full thickness scar in the apex, distal septum and inferoapex. Since his last visit in my clinic, he has been referred to Dr. Ladona Ridgel and an ICD was placed in August 2011. He saw Dr. Ladona Ridgel for follow up last week. He is doing well. He denies any chest pain. He has occasional mild SOB. No palpitations or syncope.       Current Medications (verified): 1)  Lisinopril 2.5 Mg Tabs (Lisinopril) .... Take One Tablet By Mouth Daily 2)  Metoprolol Tartrate 50 Mg Tabs (Metoprolol Tartrate) .... Take One Tablet By Mouth Twice A Day 3)  Effient 10 Mg Tabs (Prasugrel Hcl) .... Take One Tablet By Mouth Daily 4)  Aspirin Ec 325 Mg Tbec (Aspirin) .... Take One Tablet By Mouth Daily 5)  Nitrostat 0.4 Mg Subl (Nitroglycerin) .Marland Kitchen.. 1 Tablet Under Tongue At Onset of Chest Pain; You May Repeat Every 5 Minutes For Up To 3 Doses. 6)  Pradaxa 150 Mg Caps (Dabigatran Etexilate Mesylate) .... Take 1 Capsule Two Times A Day 7)  Simvastatin 40 Mg Tabs (Simvastatin) .... Take One Tablet By Mouth Daily At Bedtime  Allergies (verified): No Known Drug Allergies  Past History:  Past Medical  History: TOBACCO USER (ICD-305.1)-stopped 2011 COUMADIN THERAPY (ICD-V58.61) ATRIAL FIBRILLATION (ICD-427.31) CARDIOMYOPATHY, ISCHEMIC (ICD-414.8) diagnosed 12/10. now s/p ICD 8/11. 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 The patient smoked 1 pack of cigarettes per day for the   last 12-13 years. Stopped smoking 12/10.  He admits to social alcohol use, but denies any   illicit drug use.   He is married with 2 children.   Review of Systems       The patient complains of shortness of breath.  The patient denies fatigue, malaise, fever, weight gain/loss, vision loss, decreased hearing, hoarseness, chest pain, palpitations, prolonged cough, wheezing, sleep apnea, coughing up blood, abdominal pain, blood in stool, nausea, vomiting, diarrhea, heartburn, incontinence, blood in urine, muscle weakness, joint pain, leg swelling, rash, skin lesions, headache, fainting, dizziness, depression, anxiety, enlarged lymph nodes, easy bruising or bleeding, and environmental allergies.    Vital Signs:  Patient profile:   55 year old male Height:      71 inches Weight:      208.75 pounds BMI:     29.22 Pulse rate:   76 / minute Pulse rhythm:   irregular BP sitting:   112 / 80  (left arm) Cuff size:   large  Vitals  Entered By: Danielle Rankin, CMA (January 08, 2010 10:30 AM)  Physical Exam  General:  General: Well developed, well nourished, NAD HEENT: OP clear, mucus membranes moist SKIN: warm, dry Neuro: No focal deficits Musculoskeletal: Muscle strength 5/5 all ext Psychiatric: Mood and affect normal Neck: No JVD, no carotid bruits, no thyromegaly, no lymphadenopathy. Lungs:Clear bilaterally, no wheezes, rhonci, crackles CV: RRR no murmurs, gallops rubs Abdomen: soft, NT, ND, BS present Extremities: No  edema, pulses 2+.     ICD Specifications Following MD:  Lewayne Bunting, MD     ICD Vendor:  [facility] Jude     ICD Model Number:  [id number]     ICD Serial Number:  [id number] ICD DOI:  09/28/2009     ICD Implanting MD:  Lewayne Bunting, MD  Lead 1:    Location: RV     DOI: 09/28/2009     Model #: [id number]     Serial #: [id number]     Status: active  Indications::  ICM   ICD Follow Up ICD Dependent:  No      Episodes Coumadin:  No  Brady Parameters Mode VVI     Lower Rate Limit:  40      Tachy Zones VF:  187     Impression & Recommendations:  Problem # 1:  CAD, NATIVE VESSEL (ICD-414.01) Stable. Continue current therapy. He is on dual antiplatelet therapy.   His updated medication list for this problem includes:    Lisinopril 2.5 Mg Tabs (Lisinopril) .Marland Kitchen... Take one tablet by mouth daily    Metoprolol Tartrate 50 Mg Tabs (Metoprolol tartrate) .Marland Kitchen... Take one tablet by mouth twice a day    Effient 10 Mg Tabs (Prasugrel hcl) .Marland Kitchen... Take one tablet by mouth daily    Aspirin Ec 325 Mg Tbec (Aspirin) .Marland Kitchen... Take one tablet by mouth daily    Nitrostat 0.4 Mg Subl (Nitroglycerin) .Marland Kitchen... 1 tablet under tongue at onset of chest pain; you may repeat every 5 minutes for up to 3 doses.  Problem # 2:  CARDIOMYOPATHY, ISCHEMIC (ICD-414.8) Stable. No change in therapy.   His updated medication list for this problem includes:    Lisinopril 2.5 Mg Tabs (Lisinopril) .Marland Kitchen... Take one tablet by mouth daily    Metoprolol Tartrate 50 Mg Tabs (Metoprolol tartrate) .Marland Kitchen... Take one tablet by mouth twice a day    Effient 10 Mg Tabs (Prasugrel hcl) .Marland Kitchen... Take one tablet by mouth daily    Aspirin Ec 325 Mg Tbec (Aspirin) .Marland Kitchen... Take one tablet by mouth daily    Nitrostat 0.4 Mg Subl (Nitroglycerin) .Marland Kitchen... 1 tablet under tongue at onset of chest pain; you may repeat every 5 minutes for up to 3 doses.  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 No bleeding issues.   Problem # 4:  ATRIAL FIBRILLATION (ICD-427.31) Regular rhythm today.   His updated medication list for this problem includes:    Metoprolol Tartrate 50 Mg Tabs (Metoprolol tartrate) .Marland Kitchen... Take one tablet by mouth twice a day    Effient 10 Mg Tabs (Prasugrel hcl) .Marland Kitchen... Take one tablet by mouth daily    Aspirin Ec 325 Mg Tbec (Aspirin) .Marland Kitchen... Take one tablet by mouth daily  Patient Instructions: 1)  Your physician recommends that you schedule a follow-up appointment in: 6 months. 2)  Your physician recommends that you continue on  your current medications as directed. Please refer to the Current Medication list given to you today. Prescriptions: NITROSTAT 0.4 MG SUBL (NITROGLYCERIN) 1 tablet under tongue at onset of chest pain; you may repeat every 5 minutes for up to 3 doses.  #25 x 6   Entered by:   Whitney Maeola Sarah RN   Authorized by:   Verne Carrow, MD   Signed by:   Ellender Hose RN on 01/08/2010   Method used:   Electronically to        [facility]* (retail)       [address] [place], [place]  [address]       Ph: [phone]       Fax: [phone]   RxID:   [id number]

## 2010-03-05 NOTE — Letter (Signed)
Summary: Implantable Device Instructions  [facility], Main Office  [address]   [place], [place] [address]   Phone: [phone]  Fax: [phone]      Implantable Device Instructions  You are scheduled for:   _____ Implantable Cardioverter Defibrillator   on 09/28/09 with Dr. Johney Frame.  1.  Please arrive at the Short Stay Center at [facility] at 8:30am on the day of your procedure.  2.  Do not eat or drink after  midnight before your procedure.  3.  Complete lab work on 09/21/09 at 9am.  You do not have to be fasting.  4.  Plan for an overnight stay.  Bring your insurance cards and a list of your medications.  5.  Wash your chest and neck with antibacterial soap (any brand) the evening before and the morning of your procedure.  Rinse well.  6.  Education material received           ICD _____          *If you have ANY questions after you get home, please call the office [phone].  Jeremy Boyle  *Every attempt is made to prevent procedures from being rescheduled.  Due to the nauture of Electrophysiology, rescheduling can happen.  The physician is always aware and directs the staff when this occurs.

## 2010-03-05 NOTE — Progress Notes (Signed)
Summary: bowel movements  Phone Note Other Incoming   Summary of Call: Pt. here for lab work this AM. He states he has noticed dark bowel movements yesterday and today. No increase in bowel movements or loose stools. No visible blood noted. He is feeling well with no complaints.  I discussed with coumadin clinic and gave pt stool card to check bowel movements. Pt. aware to return card to [place] office. I instructed pt to let us know or go to ER  if frequency of bowel movements increases or if he were to become light headed or dizzy. Dr. Clifton James made aware. Initial call taken by: Dossie Arbour, RN, BSN,  July 20, 2009 1:06 PM  Follow-up for Phone Call        Agree. thanks. cdm Follow-up by: Verne Carrow, MD,  July 20, 2009 2:39 PM

## 2010-03-05 NOTE — Medication Information (Signed)
Summary: rov/ez  Anticoagulant Therapy  Managed by: Cloyde Reams, RN, BSN Referring MD: Clifton James MD, Cristal Deer PCP: No PCP at this time Supervising MD: Eden Emms MD, Theron Arista Indication 1: Atrial Fibrillation Indication 2: Acute MI Lab Used: [facility] [place] Site: [address] INR POC 2.0 INR RANGE 2.0-3.0  Dietary changes: no    Health status changes: no    Bleeding/hemorrhagic complications: no    Recent/future hospitalizations: no    Any changes in medication regimen? no    Recent/future dental: no  Any missed doses?: no       Is patient compliant with meds? yes       Allergies (verified): No Known Drug Allergies  Anticoagulation Management History:      The patient is taking warfarin and comes in today for a routine follow up visit.  Negative risk factors for bleeding include an age less than 6 years old.  The bleeding index is 'low risk'.  Negative CHADS2 values include Age > 33 years old.  Anticoagulation responsible provider: Eden Emms MD, Theron Arista.  INR POC: 2.0.  Cuvette Lot#: [id number].  Exp: 05/2010.    Anticoagulation Management Assessment/Plan:      The patient's current anticoagulation dose is Warfarin sodium 5 mg tabs: Use as directed by Anticoagulation Clinic.  The target INR is 2.0-3.0.  The next INR is due 04/12/2009.  Anticoagulation instructions were given to patient.  Results were reviewed/authorized by Cloyde Reams, RN, BSN.  He was notified by Cloyde Reams RN.         Prior Anticoagulation Instructions: INR: 2.5 Continue with same dosage of 1/2 tablet daily except 1 tablet on Wednesdays and Saturdays Recheck in 3 weeks  Current Anticoagulation Instructions: INR 2.0  Start taking 1/2 tablet daily except 1 tablet on Mondays, Wednesdays, and Saturdays.  Recheck in 3 weeks.

## 2010-03-05 NOTE — Medication Information (Signed)
Summary: rov/ewj  Anticoagulant Therapy  Managed by: Cloyde Reams, RN, BSN Referring MD: Clifton James MD, Cristal Deer PCP: No PCP at this time Supervising MD: Tenny Craw MD, Gunnar Fusi Indication 1: Atrial Fibrillation Indication 2: Acute MI Lab Used: [facility] [place] Site: [address] INR POC 4.1 INR RANGE 2.0-3.0  Dietary changes: no    Health status changes: no    Bleeding/hemorrhagic complications: no    Recent/future hospitalizations: no    Any changes in medication regimen? yes       Details: D/C Simvastatin, started on Crestor.   Recent/future dental: no  Any missed doses?: no       Is patient compliant with meds? yes       Allergies: No Known Drug Allergies  Anticoagulation Management History:      The patient is taking warfarin and comes in today for a routine follow up visit.  Negative risk factors for bleeding include an age less than 29 years old.  The bleeding index is 'low risk'.  Negative CHADS2 values include Age > 71 years old.  His last INR was 2.4 ratio.  Anticoagulation responsible provider: Tenny Craw MD, Gunnar Fusi.  INR POC: 4.1.  Cuvette Lot#: [id number].  Exp: 10/2010.    Anticoagulation Management Assessment/Plan:      The patient's current anticoagulation dose is Warfarin sodium 5 mg tabs: Use as directed by Anticoagulation Clinic.  The target INR is 2.0-3.0.  The next INR is due 08/23/2009.  Anticoagulation instructions were given to patient.  Results were reviewed/authorized by Cloyde Reams, RN, BSN.  He was notified by Cloyde Reams RN.         Prior Anticoagulation Instructions: INR 2.4  Continue same dose of 1 tablet every day except 1/2 tablet on Tuesday and Saturday.   Current Anticoagulation Instructions: INR 4.1  Skip today's dosage of coumadin, then start taking 1 tablet daily except 1/2 tablet on Tuesdays, Thursdays, and Saturdays.  Recheck in 2 weeks.

## 2010-03-05 NOTE — Assessment & Plan Note (Signed)
Summary: 6 month follow up/pla   Visit Type:  Follow-up Primary Provider:  No PCP at this time  CC:  Fatigue.  History of Present Illness: 55 yo WM with history of tobacco abuse, prior CAD who was admitted 01/22/09 with an acute anterior STEMI with thrombosis of the mid LAD stent placed in 2005. I placed a drug eluting stent in the mid LAD.  After his MI, his hospitalization was complicated by atrial fibrillation. He was in sinus rhythm at the time of discharge from the hospital. He was also found to have severe hypokinesis of the anterior wall on echo and a possible LV thrombus. He was started on coumadin with Lovenox bridge at discharge. He has been following in our coumadin clinic.   He is here today for follow up. He has had no chest pain, SOB, near syncope  or syncope. He has noticed a few episodes of skipped beats. No sustained palpitations. He has returned to work. He stopped smoking after his MI. Recent cardiac MRI with EF of 35% and full thickness scar in the apex, distal septum and inferoapex. He has had a therapeutic INR. His only complaint is of fatigue at the end of the day. He also had brown colored urine starting two days after his cardiac MRI. Clear urine yesterday but then reddish color today. No dysuria, flank pain, fever or chills.     Current Medications (verified): 1)  Lisinopril 2.5 Mg Tabs (Lisinopril) .... Take One Tablet By Mouth Daily 2)  Metoprolol Tartrate 50 Mg Tabs (Metoprolol Tartrate) .... Take One Tablet By Mouth Twice A Day 3)  Effient 10 Mg Tabs (Prasugrel Hcl) .... Take One Tablet By Mouth Daily 4)  Warfarin Sodium 5 Mg Tabs (Warfarin Sodium) .... Use As Directed By Anticoagulation Clinic 5)  Aspirin Ec 325 Mg Tbec (Aspirin) .... Take One Tablet By Mouth Daily 6)  Simvastatin 80 Mg Tabs (Simvastatin) .... Take One Tablet By Mouth Daily At Bedtime 7)  Nitrostat 0.4 Mg Subl (Nitroglycerin) .Marland Kitchen.. 1 Tablet Under Tongue At Onset of Chest Pain; You May Repeat Every  5 Minutes For Up To 3 Doses.  Allergies: No Known Drug Allergies  Past History:  Past Medical History: TOBACCO USER (ICD-305.1)-stopped last month COUMADIN THERAPY (ICD-V58.61) ATRIAL FIBRILLATION (ICD-427.31) CARDIOMYOPATHY, ISCHEMIC (ICD-414.8) diagnosed 12/10. CAD, NATIVE VESSEL (ICD-414.01)-s/p drug eluting stent LAD 12/10 MYOCARDIAL INFARCTION, ACUTE, ANTERIOR WALL (ICD-410.10) 12/10 LV mural thrombus post MI    Past Surgical History: Reviewed history from 02/06/2009 and no changes required.  1. Back surgery.   2. Vasectomy.   3. Knee surgery.   4. Shoulder surgery.      Family History: Reviewed history from 02/07/2009 and no changes required.  There is a strong family history of coronary artery   disease.  Mother alive, CAD s/p CABG, DM Father deceased 55 MI  Social History: Reviewed history from 02/07/2009 and no changes required. The patient smoked 1 pack of cigarettes per day for the   last 12-13 years. Stopped smoking 12/10.  He admits to social alcohol use, but denies any   illicit drug use.   He is married with 2 children.   Review of Systems       The patient complains of fatigue.  The patient denies malaise, fever, weight gain/loss, vision loss, decreased hearing, hoarseness, chest pain, palpitations, shortness of breath, prolonged cough, wheezing, sleep apnea, coughing up blood, abdominal pain, blood in stool, nausea, vomiting, diarrhea, heartburn, incontinence, blood in urine, muscle weakness, joint pain,  leg swelling, rash, skin lesions, headache, fainting, dizziness, depression, anxiety, enlarged lymph nodes, easy bruising or bleeding, and environmental allergies.    Vital Signs:  Patient profile:   55 year old male Height:      71 inches Weight:      204 pounds BMI:     28.56 Pulse rate:   76 / minute Resp:     12 per minute BP sitting:   120 / 75  (left arm)  Vitals Entered By: Kem Parkinson (July 19, 2009 1:53 PM)  Physical  Exam  General:  General: Well developed, well nourished, NAD HEENT: OP clear, mucus membranes moist SKIN: warm, dry Neuro: No focal deficits Musculoskeletal: Muscle strength 5/5 all ext Psychiatric: Mood and affect normal Neck: No JVD, no carotid bruits, no thyromegaly, no lymphadenopathy. Lungs:Clear bilaterally, no wheezes, rhonci, crackles CV: RRR no murmurs, gallops rubs Abdomen: soft, NT, ND, BS present Extremities: No edema, pulses 2+.    Echocardiogram  Procedure date:  04/23/2009  Findings:      Study Conclusions     Left ventricle: LVEF Is approximately 40% with akinesis of the     distal inferior, distal postrior apical walls; severe hypokinesis ot     the distal anterior and anterolateral walls. there is mild     aneurysmal dilitation of the inferoapex. The cavity size was normal.     Wall thickness was normal. Doppler parameters are consistent with     abnormal left ventricular relaxation (grade 1 diastolic     dysfunction).     MRI EXAM  Procedure date:  07/16/2009  Findings:      Findings:  The LA/RA and RV were normal.  There was no pericardial effusion.  The left ventrical was mildly dilated.  The distal septum, apex and inferoapex were dyskinetic.  The mid and distal anterior wall was hypokinetic.  There was full thickness scar involving the distal septum, apex and inferoapex.  There was subendocardial scar involving the mid and distal anterior wall. There was no mural apical thrombus.   Impression:   1)    Mild LVE with EF 35% ( EDV 146cc, ESV 95cc, SV 51cc)   2)    Full thickness scar involving the distal septum, apex and inferoapex.  Subendocardia scar involving the mid and distal anterior wall 3)    No mural apical thrombus.    EKG  Procedure date:  07/19/2009  Findings:      NSR, left axis. Poor R wave progression precordial leads.   Impression & Recommendations:  Problem # 1:  CAD, NATIVE VESSEL (ICD-414.01) Stable. No chest pain.  Continue ASA and Effient for at least one year. Continue Ace-inh, beta blocker, statin (see below) Will check U/A and BMET today with discolored urine.   His updated medication list for this problem includes:    Lisinopril 2.5 Mg Tabs (Lisinopril) .Marland Kitchen... Take one tablet by mouth daily    Metoprolol Tartrate 50 Mg Tabs (Metoprolol tartrate) .Marland Kitchen... Take one tablet by mouth twice a day    Effient 10 Mg Tabs (Prasugrel hcl) .Marland Kitchen... Take one tablet by mouth daily    Warfarin Sodium 5 Mg Tabs (Warfarin sodium) ..... Use as directed by anticoagulation clinic    Aspirin Ec 325 Mg Tbec (Aspirin) .Marland Kitchen... Take one tablet by mouth daily    Nitrostat 0.4 Mg Subl (Nitroglycerin) .Marland Kitchen... 1 tablet under tongue at onset of chest pain; you may repeat every 5 minutes for up to 3 doses.  His updated  medication list for this problem includes:    Lisinopril 2.5 Mg Tabs (Lisinopril) .Marland Kitchen... Take one tablet by mouth daily    Metoprolol Tartrate 50 Mg Tabs (Metoprolol tartrate) .Marland Kitchen... Take one tablet by mouth twice a day    Effient 10 Mg Tabs (Prasugrel hcl) .Marland Kitchen... Take one tablet by mouth daily    Warfarin Sodium 5 Mg Tabs (Warfarin sodium) ..... Use as directed by anticoagulation clinic    Aspirin Ec 325 Mg Tbec (Aspirin) .Marland Kitchen... Take one tablet by mouth daily    Nitrostat 0.4 Mg Subl (Nitroglycerin) .Marland Kitchen... 1 tablet under tongue at onset of chest pain; you may repeat every 5 minutes for up to 3 doses.  Problem # 2:  CARDIOMYOPATHY, ISCHEMIC (ICD-414.8) He is found to have full thickness scar in the apex on MRI. EF still depressed  7 months post revascularization with optimal medical therapy. Volume status ok. Will refer to EP for consideration for ICD.   His updated medication list for this problem includes:    Lisinopril 2.5 Mg Tabs (Lisinopril) .Marland Kitchen... Take one tablet by mouth daily    Metoprolol Tartrate 50 Mg Tabs (Metoprolol tartrate) .Marland Kitchen... Take one tablet by mouth twice a day    Effient 10 Mg Tabs (Prasugrel hcl) .Marland Kitchen... Take  one tablet by mouth daily    Warfarin Sodium 5 Mg Tabs (Warfarin sodium) ..... Use as directed by anticoagulation clinic    Aspirin Ec 325 Mg Tbec (Aspirin) .Marland Kitchen... Take one tablet by mouth daily    Nitrostat 0.4 Mg Subl (Nitroglycerin) .Marland Kitchen... 1 tablet under tongue at onset of chest pain; you may repeat every 5 minutes for up to 3 doses.  Orders: TLB-BMP (Basic Metabolic Panel-BMET) (80048-METABOL) EKG w/ Interpretation (93000) EP Referral (Cardiology EP Ref )  His updated medication list for this problem includes:    Lisinopril 2.5 Mg Tabs (Lisinopril) .Marland Kitchen... Take one tablet by mouth daily    Metoprolol Tartrate 50 Mg Tabs (Metoprolol tartrate) .Marland Kitchen... Take one tablet by mouth twice a day    Effient 10 Mg Tabs (Prasugrel hcl) .Marland Kitchen... Take one tablet by mouth daily    Warfarin Sodium 5 Mg Tabs (Warfarin sodium) ..... Use as directed by anticoagulation clinic    Aspirin Ec 325 Mg Tbec (Aspirin) .Marland Kitchen... Take one tablet by mouth daily    Nitrostat 0.4 Mg Subl (Nitroglycerin) .Marland Kitchen... 1 tablet under tongue at onset of chest pain; you may repeat every 5 minutes for up to 3 doses.  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 He agrees and understands the stroke risk off of anticoagulation. Will consider Pradaxa 150 mg by mouth two times a day. He will call his insurance company to discuss his co-pay. If he wishes to start, will discuss at next coumadin clinic appt.   Problem # 4:  HYPERLIPIDEMIA (ICD-272.4) With recent report on high dose Zocor and myopathy, will change to Crestor. Given samples today of 20 mg to try. Will need repeat LFTs and fasting lipids in 12 weeks.   His updated medication list for this problem includes:    Crestor 20 Mg Tabs (Rosuvastatin calcium) .Marland Kitchen... Take one tablet by mouth daily.  His updated medication list for this problem  includes:    Simvastatin 80 Mg Tabs (Simvastatin) .Marland Kitchen... Take one tablet by mouth daily at bedtime  Patient Instructions: 1)  Your  physician recommends that you schedule a follow-up appointment in: 6 months 2)  You have been referred to EP doctor 3)  Check with insurance regarding Pradaxa 150 mg two times a day 4)  Your physician has recommended you make the following change in your medication: Stop simvastatin. Start Crestor 20 mg by mouth daily. 5)  Your physician recommends that you return for a FASTING lipid profile and liver profile in 12 weeks  272.0  Prescriptions: CRESTOR 20 MG TABS (ROSUVASTATIN CALCIUM) Take one tablet by mouth daily.  #30 x 11   Entered by:   Dossie Arbour, RN, BSN   Authorized by:   Verne Carrow, MD   Signed by:   Dossie Arbour, RN, BSN on 07/19/2009   Method used:   Electronically to        [facility]* (retail)       [address] [place], [place]  [address]       Ph: [phone]       Fax: [phone]   RxID:   [id number]

## 2010-03-05 NOTE — Medication Information (Signed)
Summary: rov/tm  Anticoagulant Therapy  Managed by: Cloyde Reams, RN, BSN Referring MD: Clifton James MD, Cristal Deer PCP: No PCP at this time Supervising MD: Gala Romney MD, Reuel Boom Indication 1: Atrial Fibrillation Indication 2: Acute MI Lab Used: [facility] [place] Site: [address] INR POC 3.1 INR RANGE 2.0-3.0  Dietary changes: no    Health status changes: no    Bleeding/hemorrhagic complications: no    Recent/future hospitalizations: no    Any changes in medication regimen? no    Recent/future dental: no  Any missed doses?: no       Is patient compliant with meds? yes       Allergies: No Known Drug Allergies  Anticoagulation Management History:      The patient is taking warfarin and comes in today for a routine follow up visit.  Negative risk factors for bleeding include an age less than 52 years old.  The bleeding index is 'low risk'.  Negative CHADS2 values include Age > 24 years old.  Anticoagulation responsible provider: Grigor Lipschutz MD, Reuel Boom.  INR POC: 3.1.  Cuvette Lot#: [id number].  Exp: 09/2010.    Anticoagulation Management Assessment/Plan:      The patient's current anticoagulation dose is Warfarin sodium 5 mg tabs: Use as directed by Anticoagulation Clinic.  The target INR is 2.0-3.0.  The next INR is due 08/02/2009.  Anticoagulation instructions were given to patient.  Results were reviewed/authorized by Cloyde Reams, RN, BSN.  He was notified by Cloyde Reams RN.         Prior Anticoagulation Instructions: INR 2.4 Continue 5mg s daily except 2.5mg s on Tuesdays and Saturdays. Recheck in 4 weeks.   Current Anticoagulation Instructions: INR 3.1  Take 1/2 tablet today, Increase Vit K intake by 1 serving then resume same dosage 1 tablet daily except 1/2 tablet on Tuesdays and Saturdays.  Recheck in 4 weeks.

## 2010-03-05 NOTE — Medication Information (Signed)
Summary: rov/ewj  Anticoagulant Therapy  Managed by: Eda Keys, PharmD Referring MD: Clifton James MD, Cristal Deer PCP: No PCP at this time Supervising MD: Eden Emms MD, Theron Arista Indication 1: Atrial Fibrillation Indication 2: Acute MI Lab Used: [facility] [place] Site: [address] INR POC 2.0 INR RANGE 2.0-3.0  Dietary changes: no    Health status changes: no    Bleeding/hemorrhagic complications: no    Recent/future hospitalizations: no    Any changes in medication regimen? no    Recent/future dental: no  Any missed doses?: no       Is patient compliant with meds? yes      Comments: Pt experienced one episode of light headedness after exertion, lasted about 10 minutes and was relieved with rest and a snack 04/23/09 - pt to have ultrasound  After Ultrasound, pt will see Dr. Clifton James to determine if he can d/c coumadin...this will be in April  Allergies: No Known Drug Allergies  Anticoagulation Management History:      The patient is taking warfarin and comes in today for a routine follow up visit.  Negative risk factors for bleeding include an age less than 50 years old.  The bleeding index is 'low risk'.  Negative CHADS2 values include Age > 78 years old.  Anticoagulation responsible provider: Eden Emms MD, Theron Arista.  INR POC: 2.0.  Cuvette Lot#: [id number].  Exp: 06/2010.    Anticoagulation Management Assessment/Plan:      The patient's current anticoagulation dose is Warfarin sodium 5 mg tabs: Use as directed by Anticoagulation Clinic.  The target INR is 2.0-3.0.  The next INR is due 05/03/2009.  Anticoagulation instructions were given to patient.  Results were reviewed/authorized by Eda Keys, PharmD.  He was notified by Eda Keys.         Prior Anticoagulation Instructions: INR 2.0  Start taking 1/2 tablet daily except 1 tablet on Mondays, Wednesdays, and Saturdays.  Recheck in 3 weeks.    Current Anticoagulation Instructions: INR 2.0  Start  NEW dosing schedule of 1/2 tablet on Tuesday, Thursday, and Saturday, and 1 tablet all other days.  Return to clinic in 3 weeks.

## 2010-03-05 NOTE — Medication Information (Signed)
Summary: rov/eh  Anticoagulant Therapy  Managed by: Shelby Dubin, PharmD Referring MD: Clifton James MD, Cristal Deer PCP: No PCP at this time Supervising MD: Jens Som MD, Arlys John Indication 1: Atrial Fibrillation Indication 2: Acute MI Lab Used: [facility] [place] Site: [address] INR POC 2.5 INR RANGE 2.0-3.0  Dietary changes: no    Health status changes: no    Bleeding/hemorrhagic complications: no    Recent/future hospitalizations: no    Any changes in medication regimen? no    Recent/future dental: no  Any missed doses?: no       Is patient compliant with meds? yes       Allergies (verified): No Known Drug Allergies  Anticoagulation Management History:      The patient is taking warfarin and comes in today for a routine follow up visit.  Negative risk factors for bleeding include an age less than 72 years old.  The bleeding index is 'low risk'.  Negative CHADS2 values include Age > 43 years old.  Anticoagulation responsible provider: Jens Som MD, Arlys John.  INR POC: 2.5.  Cuvette Lot#: [id number].  Exp: 05/2010.    Anticoagulation Management Assessment/Plan:      The patient's current anticoagulation dose is Warfarin sodium 5 mg tabs: Use as directed by Anticoagulation Clinic.  The target INR is 2.0-3.0.  The next INR is due 03/22/2009.  Anticoagulation instructions were given to patient.  Results were reviewed/authorized by Shelby Dubin, PharmD.  He was notified by Ysidro Evert, Pharm D Candidate.         Prior Anticoagulation Instructions: INR 4.6  Skip today's dose then decrease dose to 0.5 tablet daily except 1 tablet on Wednesdays and Saturdays. Recheck in 10 days.  Current Anticoagulation Instructions: INR: 2.5 Continue with same dosage of 1/2 tablet daily except 1 tablet on Wednesdays and Saturdays Recheck in 3 weeks

## 2010-03-05 NOTE — Assessment & Plan Note (Signed)
Summary: pc2/jml   Visit Type:  Follow-up Jeremy Boyle:  No PCP at this time   History of Present Illness: Jeremy Boyle returns  today for ICD followup.  He is a pleasant 55 yo man with an ICM, CHF, and HTN.  He is s/p ICD.  He denies c/p or sob.  No peripheral edema. He notes occaisional soreness at his ICD insertion site but this is improved.  Current Medications (verified): 1)  Lisinopril 2.5 Mg Tabs (Lisinopril) .... Take One Tablet By Mouth Daily 2)  Metoprolol Tartrate 50 Mg Tabs (Metoprolol Tartrate) .... Take One Tablet By Mouth Twice A Day 3)  Effient 10 Mg Tabs (Prasugrel Hcl) .... Take One Tablet By Mouth Daily 4)  Aspirin Ec 325 Mg Tbec (Aspirin) .... Take One Tablet By Mouth Daily 5)  Nitrostat 0.4 Mg Subl (Nitroglycerin) .Marland Kitchen.. 1 Tablet Under Tongue At Onset of Chest Pain; You May Repeat Every 5 Minutes For Up To 3 Doses. 6)  Pradaxa 150 Mg Caps (Dabigatran Etexilate Mesylate) .... Take 1 Capsule Two Times A Day 7)  Simvastatin 40 Mg Tabs (Simvastatin) .... Take One Tablet By Mouth Daily At Bedtime  Allergies (verified): No Known Drug Allergies  Past History:  Past Medical History: Last updated: 07/19/2009 TOBACCO USER (ICD-305.1)-stopped last month COUMADIN THERAPY (ICD-V58.61) ATRIAL FIBRILLATION (ICD-427.31) CARDIOMYOPATHY, ISCHEMIC (ICD-414.8) diagnosed 12/10. CAD, NATIVE VESSEL (ICD-414.01)-s/p drug eluting stent LAD 12/10 MYOCARDIAL INFARCTION, ACUTE, ANTERIOR WALL (ICD-410.10) 12/10 LV mural thrombus post MI    Past Surgical History: Last updated: 02/06/2009  1. Back surgery.   2. Vasectomy.   3. Knee surgery.   4. Shoulder surgery.      Vital Signs:  Patient profile:   55 year old male Height:      71 inches Weight:      211 pounds BMI:     29.53 Pulse rate:   72 / minute BP sitting:   126 / 88  (left arm)  Vitals Entered By: Laurance Flatten CMA (January 01, 2010 3:16 PM)  Physical Exam  General:  General: Well developed, well nourished,  NAD HEENT: OP clear, mucus membranes moist SKIN: warm, dry Neuro: No focal deficits Musculoskeletal: Muscle strength 5/5 all ext Psychiatric: Mood and affect normal Neck: No JVD, no carotid bruits, no thyromegaly, no lymphadenopathy. Lungs:Clear bilaterally, no wheezes, rhonci, crackles CV: RRR no murmurs, gallops rubs Abdomen: soft, NT, ND, BS present Extremities: No edema, pulses 2+.     ICD Specifications Following MD:  Lewayne Bunting, MD     ICD Vendor:  [facility]     ICD Model Number:  [id number]     ICD Serial Number:  [id number] ICD DOI:  09/28/2009     ICD Implanting MD:  Lewayne Bunting, MD  Lead 1:    Location: RV     DOI: 09/28/2009     Model #: [id number]     Serial #: [id number]     Status: active  Indications::  ICM   ICD Follow Up Battery Voltage:  94% V     Charge Time:  7.8 seconds     Battery Est. Longevity:  8.2-9.1 yrs Underlying rhythm:  SR ICD Dependent:  No       ICD Device Measurements Right Ventricle:  Amplitude: 12.0 mV, Impedance: 610 ohms, Threshold: 0.75 V at 0.5 msec Shock Impedance: 48 ohms   Episodes MS Episodes:  0     Coumadin:  No Shock:  0     ATP:  0  Nonsustained:  0     Atrial Therapies:  0 Ventricular Pacing:  <1%  Brady Parameters Mode VVI     Lower Rate Limit:  40      Tachy Zones VF:  187     Next Remote Date:  04/04/2010     Next Cardiology Appt Due:  12/05/2010 Tech Comments:  NORMAL DEVICE FUNCTION.  NO EPISODES SINCE LAST CHECK.  CHANGED RV OUTPUT FROM 3.5 TO 2.5 V.  PT TO BE ENROLLED IN [place] AND FIRST TRANSMISSION 04-04-10 AND ROV IN 12 MTHS W/GT. Vella Kohler  January 01, 2010 3:09 PM MD Comments:  Agree with above.  Impression & Recommendations:  Problem # 1:  AUTOMATIC IMPLANTABLE CARDIAC DEFIBRILLATOR SITU (ICD-V45.02) His device is working normally.  Will followup in several months.  Problem # 2:  ATRIAL FIBRILLATION (ICD-427.31) His has not had any symptoms. We might consider stopping his coumadin at some point as his atrial  fib occurred in the setting his MI. His updated medication list for this problem includes:    Metoprolol Tartrate 50 Mg Tabs (Metoprolol tartrate) .Marland Kitchen... Take one tablet by mouth twice a day    Effient 10 Mg Tabs (Prasugrel hcl) .Marland Kitchen... Take one tablet by mouth daily    Aspirin Ec 325 Mg Tbec (Aspirin) .Marland Kitchen... Take one tablet by mouth daily  Problem # 3:  CAD, NATIVE VESSEL (ICD-414.01) He denies anginal symptoms.  Continue meds as below. His updated medication list for this problem includes:    Lisinopril 2.5 Mg Tabs (Lisinopril) .Marland Kitchen... Take one tablet by mouth daily    Metoprolol Tartrate 50 Mg Tabs (Metoprolol tartrate) .Marland Kitchen... Take one tablet by mouth twice a day    Effient 10 Mg Tabs (Prasugrel hcl) .Marland Kitchen... Take one tablet by mouth daily    Aspirin Ec 325 Mg Tbec (Aspirin) .Marland Kitchen... Take one tablet by mouth daily    Nitrostat 0.4 Mg Subl (Nitroglycerin) .Marland Kitchen... 1 tablet under tongue at onset of chest pain; you may repeat every 5 minutes for up to 3 doses.  Patient Instructions: 1)  Your physician wants you to follow-up in: 12 months with Dr Court Joy will receive a reminder letter in the mail two months in advance. If you don't receive a letter, please call our office to schedule the follow-up appointment. 2)  Merlin transmission 04/04/2010

## 2010-03-05 NOTE — Medication Information (Signed)
Summary: rov/sp  Anticoagulant Therapy  Managed by: Bethena Midget, RN, BSN Referring MD: Clifton James MD, Cristal Deer PCP: No PCP at this time Supervising MD: Zacari Radick MD, Reuel Boom Indication 1: Atrial Fibrillation Indication 2: Acute MI Lab Used: [facility] [place] Site: [address] INR POC 2.4 INR RANGE 2.0-3.0  Dietary changes: no    Health status changes: no    Bleeding/hemorrhagic complications: no    Recent/future hospitalizations: no    Any changes in medication regimen? no    Recent/future dental: no  Any missed doses?: no       Is patient compliant with meds? yes      Comments: Pending Cardiac MRI for possible coumadin stoppage.   Allergies: No Known Drug Allergies  Anticoagulation Management History:      The patient is taking warfarin and comes in today for a routine follow up visit.  Negative risk factors for bleeding include an age less than 88 years old.  The bleeding index is 'low risk'.  Negative CHADS2 values include Age > 46 years old.  Anticoagulation responsible provider: Sadik Piascik MD, Reuel Boom.  INR POC: 2.4.  Cuvette Lot#: [id number].  Exp: 07/2010.    Anticoagulation Management Assessment/Plan:      The patient's current anticoagulation dose is Warfarin sodium 5 mg tabs: Use as directed by Anticoagulation Clinic.  The target INR is 2.0-3.0.  The next INR is due 07/05/2009.  Anticoagulation instructions were given to patient.  Results were reviewed/authorized by Bethena Midget, RN, BSN.  He was notified by Bethena Midget, RN, BSN.         Prior Anticoagulation Instructions: INR 2.9  Continue same dose of 1 tablet every day except 1/2 tablet on Tuesday and Saturday.  Try to increase you leafy green vegetables to 1-2 servings/week   Current Anticoagulation Instructions: INR 2.4 Continue 5mg s daily except 2.5mg s on Tuesdays and Saturdays. Recheck in 4 weeks.

## 2010-03-05 NOTE — Medication Information (Signed)
Summary: rov/ewj  Anticoagulant Therapy  Managed by: Bethena Midget, RN, BSN Referring MD: Clifton James MD, Cristal Deer PCP: No PCP at this time Supervising MD: Shirlee Latch MD, [place] Indication 1: Atrial Fibrillation Indication 2: Acute MI Lab Used: [facility] [place] Site: [address] INR POC 2.7 INR RANGE 2.0-3.0  Dietary changes: no    Health status changes: no    Bleeding/hemorrhagic complications: no    Recent/future hospitalizations: no    Any changes in medication regimen? yes       Details: Switched from Simvastatin to Crestor.  Recent/future dental: no  Any missed doses?: no       Is patient compliant with meds? yes      Comments: Wants to switch to Pradaxa   Allergies: No Known Drug Allergies  Anticoagulation Management History:      The patient is taking warfarin and comes in today for a routine follow up visit.  Negative risk factors for bleeding include an age less than 6 years old.  The bleeding index is 'low risk'.  Negative CHADS2 values include Age > 74 years old.  His last INR was 2.4 ratio.  Anticoagulation responsible provider: Shirlee Latch MD, Dalton.  INR POC: 2.7.  Cuvette Lot#: [id number].  Exp: 11/2010.    Anticoagulation Management Assessment/Plan:      The patient's current anticoagulation dose is Warfarin sodium 5 mg tabs: Use as directed by Anticoagulation Clinic.  The target INR is 2.0-3.0.  The next INR is due 09/06/2009.  Anticoagulation instructions were given to patient.  Results were reviewed/authorized by Bethena Midget, RN, BSN.  He was notified by Bethena Midget, RN, BSN.         Prior Anticoagulation Instructions: INR 4.1  Skip today's dosage of coumadin, then start taking 1 tablet daily except 1/2 tablet on Tuesdays, Thursdays, and Saturdays.  Recheck in 2 weeks.    Current Anticoagulation Instructions: INR 2.7 Continue 5mg s everyday except 2.5mg s onTuesdays, Thursdays and Saturdays. Recheck in 2 weeks. Take last dose of coumadin on   09/03/09 and do not start Pradaxa until after appt on 09/06/09.  Prescriptions: PRADAXA 150 MG CAPS (DABIGATRAN ETEXILATE MESYLATE) Take 1 capsule two times a day  #60 x 3   Entered by:   Bethena Midget, RN, BSN   Authorized by:   Verne Carrow, MD   Signed by:   Bethena Midget, RN, BSN on 08/23/2009   Method used:   Electronically to        [facility]* (retail)       [address] [place], [place]  [address]       Ph: [phone]       Fax: [phone]   RxID:   [id number]

## 2010-03-05 NOTE — Progress Notes (Signed)
Summary: pt wants to talk to Dr. Clifton James  Phone Note Call from Patient Call back at Home Phone [phone] Call back at [phone]   Caller: Patient Reason for Call: Talk to Nurse, Talk to Doctor Summary of Call: pt wants to talk to Dr. Clifton James about severral things like meds and he did not want to go into with me Initial call taken by: Omer Jack,  October 30, 2009 11:19 AM  Follow-up for Phone Call        left message. Whitney Maeola Sarah RN  October 30, 2009 1:43 PM'  Pt. would like to talk to [facility] regarding his medical care. Will get [place] to call him tomorrow in the office. Whitney Maeola Sarah RN  October 31, 2009 10:12 AM      Appended Document: pt wants to talk to Dr. Eloy End to call pt. will try again tomorrow. cdm

## 2010-03-05 NOTE — Medication Information (Signed)
Summary: rov/tm  Anticoagulant Therapy  Managed by: Bethena Midget, RN, BSN Referring MD: Clifton James MD, Cristal Deer PCP: No PCP at this time Supervising MD: Clifton James MD, Cristal Deer Indication 1: Atrial Fibrillation Indication 2: Acute MI Lab Used: [facility] [place] Site: [address] INR POC 1.9 INR RANGE 2.0-3.0  Dietary changes: no    Health status changes: no    Bleeding/hemorrhagic complications: no    Recent/future hospitalizations: no    Any changes in medication regimen? no    Recent/future dental: no  Any missed doses?: yes     Details: Took last dose on Monday for Pradaxa start  Is patient compliant with meds? yes       Allergies: No Known Drug Allergies  Anticoagulation Management History:      The patient is taking warfarin and comes in today for a routine follow up visit.  Negative risk factors for bleeding include an age less than 56 years old.  The bleeding index is 'low risk'.  Negative CHADS2 values include Age > 56 years old.  His last INR was 2.4 ratio.  Anticoagulation responsible Jlon Betker: Clifton James MD, Cristal Deer.  INR POC: 1.9.  Cuvette Lot#: [id number].  Exp: 11/2010.    Anticoagulation Management Assessment/Plan:      The patient's current anticoagulation dose is Warfarin sodium 5 mg tabs: Use as directed by Anticoagulation Clinic.  The target INR is 2.0-3.0.  The next INR is due 09/06/2009.  Anticoagulation instructions were given to patient.  Results were reviewed/authorized by Bethena Midget, RN, BSN.  He was notified by Bethena Midget, RN, BSN.         Prior Anticoagulation Instructions: INR 2.7 Continue 5mg s everyday except 2.5mg s onTuesdays, Thursdays and Saturdays. Recheck in 2 weeks. Take last dose of coumadin on  09/03/09 and do not start Pradaxa until after appt on 09/06/09.   Current Anticoagulation Instructions: INR 1.9 Start Pradaxa 150mg  twice a day every 12 hrs apart. Start first dose today. Lab work 09/13/09

## 2010-03-05 NOTE — Medication Information (Signed)
Summary: Coumadin Clinic  Anticoagulant Therapy  Managed by: Weston Brass, PharmD Referring MD: Clifton James MD, Cristal Deer PCP: No PCP at this time Supervising MD: Juanda Chance MD, Cliffton Spradley Indication 1: Atrial Fibrillation Indication 2: Acute MI Lab Used: [facility] [place] Site: [address] INR POC 2.4 INR RANGE 2.0-3.0  Dietary changes: no    Health status changes: no    Bleeding/hemorrhagic complications: yes       Details: has blood in urine.  Also noticed dark stools.  H/H stable.  Has appt with urology next week.  Given stool cards to get samples.  Dr. Clifton James aware.  Recent/future hospitalizations: no    Any changes in medication regimen? no    Recent/future dental: no  Any missed doses?: no       Is patient compliant with meds? yes      Comments: Pt sent to lab to have INR checked by Dr. Clifton James given pt's blood in urine  Allergies: No Known Drug Allergies  Anticoagulation Management History:      His anticoagulation is being managed by telephone today.  Negative risk factors for bleeding include an age less than 81 years old.  The bleeding index is 'low risk'.  Negative CHADS2 values include Age > 58 years old.  His last INR was 2.4 ratio.  Anticoagulation responsible provider: Juanda Chance MD, Smitty Cords.  INR POC: 2.4.  Exp: 09/2010.    Anticoagulation Management Assessment/Plan:      The patient's current anticoagulation dose is Warfarin sodium 5 mg tabs: Use as directed by Anticoagulation Clinic.  The target INR is 2.0-3.0.  The next INR is due 08/09/2009.  Anticoagulation instructions were given to patient.  Results were reviewed/authorized by Weston Brass, PharmD.  He was notified by Weston Brass PharmD.         Prior Anticoagulation Instructions: INR 3.1  Take 1/2 tablet today, Increase Vit K intake by 1 serving then resume same dosage 1 tablet daily except 1/2 tablet on Tuesdays and Saturdays.  Recheck in 4 weeks.    Current Anticoagulation Instructions: INR  2.4  Continue same dose of 1 tablet every day except 1/2 tablet on Tuesday and Saturday.

## 2010-03-05 NOTE — Medication Information (Signed)
Summary: rov/tm  Anticoagulant Therapy  Managed by: Bethena Midget, RN, BSN Referring MD: Clifton James MD, Sherrilyn Rist MD: Clifton James MD, Cristal Deer Indication 1: Atrial Fibrillation Indication 2: Acute MI Lab Used: [facility] [place] Site: [address] INR POC 3.7 INR RANGE 2.0-3.0  Dietary changes: no    Health status changes: no    Bleeding/hemorrhagic complications: no    Recent/future hospitalizations: no    Any changes in medication regimen? no    Recent/future dental: no  Any missed doses?: no       Is patient compliant with meds? yes      Comments: Seeing Dr. Clifton James today   Allergies: No Known Drug Allergies  Anticoagulation Management History:      The patient is taking warfarin and comes in today for a routine follow up visit.  Negative risk factors for bleeding include an age less than 28 years old.  The bleeding index is 'low risk'.  Negative CHADS2 values include Age > 87 years old.  Anticoagulation responsible provider: Clifton James MD, Cristal Deer.  INR POC: 3.7.  Cuvette Lot#: [id number].  Exp: 03/2010.    Anticoagulation Management Assessment/Plan:      The patient's current anticoagulation dose is Warfarin sodium 5 mg tabs: Use as directed by Anticoagulation Clinic.  The next INR is due 02/19/2009.  Anticoagulation instructions were given to patient.  Results were reviewed/authorized by Bethena Midget, RN, BSN.  He was notified by Bethena Midget, RN, BSN.         Prior Anticoagulation Instructions: INR 2.7  START TAKING 0.5 TABLETS ON THURSDAYS AND TAKE 1 TABLET REST OF THE WEEK.   Current Anticoagulation Instructions: INR 3.7 Skip today Then take 1 pill everyday except 1/2 pill Tuesdays, Thursdays, and Sundays. Recheck INR on 02/19/09.

## 2010-03-05 NOTE — Medication Information (Signed)
Summary: rov/tm  Anticoagulant Therapy  Managed by: Shelby Dubin, PharmD Referring MD: Clifton James MD, Cristal Deer PCP: No PCP at this time Supervising MD: Jens Som MD, Arlys John Indication 1: Atrial Fibrillation Indication 2: Acute MI Lab Used: [facility] [place] Site: [address] INR POC 4.6 INR RANGE 2.0-3.0  Dietary changes: no    Health status changes: no    Bleeding/hemorrhagic complications: no    Recent/future hospitalizations: no    Any changes in medication regimen? no    Recent/future dental: no  Any missed doses?: no       Is patient compliant with meds? yes       Current Medications (verified): 1)  Lisinopril 2.5 Mg Tabs (Lisinopril) .... Take One Tablet By Mouth Daily 2)  Metoprolol Tartrate 50 Mg Tabs (Metoprolol Tartrate) .... Take One Tablet By Mouth Twice A Day 3)  Effient 10 Mg Tabs (Prasugrel Hcl) .... Take One Tablet By Mouth Daily 4)  Warfarin Sodium 5 Mg Tabs (Warfarin Sodium) .... Use As Directed By Anticoagulation Clinic 5)  Aspirin Ec 325 Mg Tbec (Aspirin) .... Take One Tablet By Mouth Daily 6)  Simvastatin 80 Mg Tabs (Simvastatin) .... Take One Tablet By Mouth Daily At Bedtime  Allergies (verified): No Known Drug Allergies  Anticoagulation Management History:      The patient is taking warfarin and comes in today for a routine follow up visit.  Negative risk factors for bleeding include an age less than 37 years old.  The bleeding index is 'low risk'.  Negative CHADS2 values include Age > 31 years old.  Anticoagulation responsible provider: Jens Som MD, Arlys John.  INR POC: 4.6.  Cuvette Lot#: [id number].  Exp: 05/2010.    Anticoagulation Management Assessment/Plan:      The patient's current anticoagulation dose is Warfarin sodium 5 mg tabs: Use as directed by Anticoagulation Clinic.  The target INR is 2.0-3.0.  The next INR is due 03/01/2009.  Anticoagulation instructions were given to patient.  Results were reviewed/authorized by Shelby Dubin, PharmD.  He was notified by Lew Dawes, PharmD Candidate.         Prior Anticoagulation Instructions: INR 3.7 Skip today Then take 1 pill everyday except 1/2 pill Tuesdays, Thursdays, and Sundays. Recheck INR on 02/19/09.   Current Anticoagulation Instructions: INR 4.6  Skip today's dose then decrease dose to 0.5 tablet daily except 1 tablet on Wednesdays and Saturdays. Recheck in 10 days.

## 2010-03-07 NOTE — Progress Notes (Signed)
Summary: letter for disability  Phone Note Call from Patient Call back at Home Phone [phone]   Caller: Patient Reason for Call: Talk to Nurse Summary of Call: wants to dicuss ss disability - letter.  Initial call taken by: Lorne Skeens,  February 01, 2010 9:26 AM  Follow-up for Phone Call        Needs a letter for [facility] stating everything that has gone on with him and his heart.  Pt wants Dr Clifton James to talk to Dr Ladona Ridgel also about his need for disability?  Instructed pt that I will follow his request to MD. Follow-up by: Charolotte Capuchin, RN,  February 01, 2010 10:21 AM  Additional Follow-up for Phone Call Additional follow up Details #1::        I spoke to pt. letter generated. Additional Follow-up by: Verne Carrow, MD,  February 07, 2010 9:19 AM

## 2010-03-07 NOTE — Letter (Signed)
Summary: Generic Letter  [facility], Main Office  [address]   [place], [place] [address]   Phone: [phone]  Fax: [phone]    02/07/2010  [facility] [address] [place], [place]  [address]  To Whom It May Concern,  Mr. Aero Drummonds is a pleasant 55 year old white male with history of coronary artery disease  who was admitted 01/22/09 to [facility] in [place]  with an acute anterior myocardial infarction with thrombosis of the mid LAD stent placed in 2005. I placed a drug eluting stent in the mid LAD.  After his MI, his hospitalization was complicated by atrial fibrillation. He was in sinus rhythm at the time of discharge from the hospital. He was also found to have severe hypokinesis of the anterior wall on echocardiogram and had a possible left ventricular apical thrombus. He was started on coumadin with Lovenox bridge at discharge. He has anticoagulated with chronic therapy. Cardiac MRI this spring with EF of 35% and full thickness scar in the apex, distal septum and inferoapex. Since his last visit in my clinic, he has been referred to Dr. Ladona Ridgel and an ICD was placed in August 2011. He continues to have dyspnea with exertion and fatigue, all likely related to his cardiomyopathy. He will likely not have a significant improvement in his cardiac function. I will be glad to provide further documentation of his condition is necessary. His records are available for request from our office.    Sincerely,   Verne Carrow, MD

## 2010-03-29 NOTE — Letter (Signed)
March 28, 2010    RE:  JAYCEION, LISENBY MRN:  [id number]  /  DOB:  11-Jan-1956  I am writing this letter on behalf of my patient, Jeremy Boyle. Onge.  The patient has a longstanding ischemic cardiomyopathy and is status post myocardial infarction in 2005.  At that time, he underwent coronary stenting.  He had a second myocardial infarction in December 2010.  At catheterization, he was found to have an ejection fraction of 30-35%. He has class II congestive heart failure.  Subsequent MRI scanning in June 2011 demonstrated an ejection fraction of 35% and the patient subsequently underwent insertion of a prophylactic defibrillator several weeks later.  This was carried out on September 28, 2009.  Medicare guidelines, specifically those found in section 20.4 of Medicare guidelines specifically state that criteria for defibrillator implantation includes those with ischemic dilated cardiomyopathy, documented prior myocardial infarction, New York Heart class II-III heart failure with a measured left ventricular ejection fraction of less than or equal to 35%.  It is unclear to me why the [facility] coverage has not been willing to cover this patient's device.  The Medicare guidelines very clearly state that this is a covered device implantation.  If there are any other questions about this, please do not hesitate to contact me.  I might also mention that the patient's medications have been maximized such that he was on an ACE inhibitor as well as a beta-blocker prior to his decision for ICD implant.   Sincerely,     Doylene Canning. Ladona Ridgel, MD   GWT/MedQ  DD: 03/28/2010  DT: 03/29/2010  Job #: [id number]

## 2010-04-04 ENCOUNTER — Encounter (INDEPENDENT_AMBULATORY_CARE_PROVIDER_SITE_OTHER): Payer: PRIVATE HEALTH INSURANCE

## 2010-04-04 DIAGNOSIS — I428 Other cardiomyopathies: Secondary | ICD-10-CM

## 2010-04-05 ENCOUNTER — Encounter: Payer: Self-pay | Admitting: Internal Medicine

## 2010-04-22 ENCOUNTER — Encounter: Payer: Self-pay | Admitting: *Deleted

## 2010-05-02 NOTE — Letter (Signed)
Summary: Remote Device Check  [employer], Main Office  [address]   [place], [place] [address]   Phone: [phone]  Fax: [phone]     April 22, 2010 MRN: [id number]   [facility] [address] [place], [place]  [address]   Dear [name],   Your remote transmission was recieved and reviewed by your physician.  All diagnostics were within normal limits for you.  __X___Your next transmission is scheduled for:  07-04-2010.  Please transmit at any time this day.  If you have a wireless device your transmission will be sent automatically.    Sincerely,  Vella Kohler

## 2010-05-02 NOTE — Cardiovascular Report (Signed)
Summary: Office Visit   Office Visit   Imported By: Roderic Ovens 04/23/2010 15:48:14  _____________________________________________________________________  External Attachment:    Type:   Image     Comment:   External Document

## 2010-05-02 NOTE — Progress Notes (Signed)
Summary: pt request call  Phone Note Call from Patient Call back at Home Phone [phone]   Caller: Patient Reason for Call: Talk to Nurse Initial call taken by: Judie Grieve,  February 27, 2010 1:20 PM  Follow-up for Phone Call        Patient stated that the billing department at [facility] called him and said that his defibrillator is not covered. He thought that we took care of this months ago. I will call [employer] @ [facility] regarding this issue. Whitney Maeola Sarah RN  February 27, 2010 2:05 PM   pt calling back to talk with whitney [phone] Glynda Jaeger  February 27, 2010 2:26 PM Follow-up by: Whitney Maeola Sarah RN,  February 27, 2010 2:05 PM  Additional Follow-up for Phone Call Additional follow up Details #1::        [employer] called billing @ [facility] stating that this procedure was not necessary and the patient will over $17, 709 Spoke with Peggy in billing and her advice was is to contact [employer]. I am waiting to hear back from Charmaine to determine if this was precerted or not.Ellender Hose RN  February 27, 2010 5:35 PM   Waiting to hear back from [place]. She is discussing this issue with the head of billing at [facility]. Whitney Maeola Sarah RN  March 01, 2010 11:06 AM  Information has been sent & is being reviewed by billing right now. We will be in touch with him once this issue is resolved. Whitney Maeola Sarah RN  April 22, 2010 9:06 AM  Additional Follow-up by: Whitney Maeola Sarah RN,  April 22, 2010 9:07 AM

## 2010-05-06 LAB — HEPARIN LEVEL (UNFRACTIONATED)

## 2010-05-06 LAB — BASIC METABOLIC PANEL
BUN: 8 mg/dL (ref 6–23)
CO2: 24 mEq/L (ref 19–32)
CO2: 27 mEq/L (ref 19–32)
Calcium: 8.5 mg/dL (ref 8.4–10.5)
Calcium: 8.6 mg/dL (ref 8.4–10.5)
Chloride: 106 mEq/L (ref 96–112)
Creatinine, Ser: 0.8 mg/dL (ref 0.4–1.5)
GFR calc Af Amer: >60 mL/min (ref >60)
Glucose, Bld: 126 mg/dL — ABNORMAL HIGH (ref 70–99)
Glucose, Bld: 95 mg/dL (ref 70–99)
Sodium: 138 mEq/L (ref 135–145)

## 2010-05-06 LAB — CBC
HCT: 39.3 % (ref 39.0–52.0)
HCT: 40.2 % (ref 39.0–52.0)
HCT: 40.7 % (ref 39.0–52.0)
Hemoglobin: 13.4 g/dL (ref 13.0–17.0)
Hemoglobin: 13.8 g/dL (ref 13.0–17.0)
Hemoglobin: 14.2 g/dL (ref 13.0–17.0)
MCHC: 34.1 g/dL (ref 30.0–36.0)
MCHC: 34.3 g/dL (ref 30.0–36.0)
MCHC: 34.4 g/dL (ref 30.0–36.0)
MCHC: 34.9 g/dL (ref 30.0–36.0)
MCV: 90.7 fL (ref 78.0–100.0)
MCV: 91.6 fL (ref 78.0–100.0)
Platelets: 175 10*3/uL (ref 150–400)
Platelets: 176 K/uL (ref 150–400)
Platelets: 182 K/uL (ref 150–400)
RBC: 4.29 MIL/uL (ref 4.22–5.81)
RBC: 4.44 MIL/uL (ref 4.22–5.81)
RBC: 4.52 MIL/uL (ref 4.22–5.81)
RDW: 13 % (ref 11.5–15.5)
RDW: 13.2 % (ref 11.5–15.5)
RDW: 13.5 % (ref 11.5–15.5)
WBC: 10.5 K/uL (ref 4.0–10.5)
WBC: 13.1 K/uL — ABNORMAL HIGH (ref 4.0–10.5)

## 2010-05-06 LAB — BASIC METABOLIC PANEL WITH GFR
BUN: 11 mg/dL (ref 6–23)
CO2: 25 meq/L (ref 19–32)
Calcium: 9.2 mg/dL (ref 8.4–10.5)
Chloride: 106 meq/L (ref 96–112)
Creatinine, Ser: 0.94 mg/dL (ref 0.4–1.5)
GFR calc non Af Amer: >60 mL/min
Glucose, Bld: 135 mg/dL — ABNORMAL HIGH (ref 70–99)
Potassium: 4.8 meq/L (ref 3.5–5.1)
Sodium: 139 meq/L (ref 135–145)

## 2010-05-06 LAB — PROTIME-INR
INR: 0.99 (ref 0.00–1.49)
INR: 1.02 (ref 0.00–1.49)
Prothrombin Time: 13 s (ref 11.6–15.2)
Prothrombin Time: 13.3 s (ref 11.6–15.2)

## 2010-05-06 LAB — LIPID PANEL
HDL: 35 mg/dL — ABNORMAL LOW
Total CHOL/HDL Ratio: 4.2 ratio
Triglycerides: 160 mg/dL — ABNORMAL HIGH
VLDL: 32 mg/dL (ref 0–40)

## 2010-05-06 LAB — COMPREHENSIVE METABOLIC PANEL
AST: 38 U/L — ABNORMAL HIGH (ref 0–37)
Albumin: 3.7 g/dL (ref 3.5–5.2)
Alkaline Phosphatase: 59 U/L (ref 39–117)
CO2: 21 mEq/L (ref 19–32)
Chloride: 105 mEq/L (ref 96–112)
GFR calc non Af Amer: >60 mL/min (ref >60)
Glucose, Bld: 121 mg/dL — ABNORMAL HIGH (ref 70–99)
Potassium: 4 mEq/L (ref 3.5–5.1)
Total Bilirubin: 0.7 mg/dL (ref 0.3–1.2)

## 2010-05-06 LAB — CARDIAC PANEL(CRET KIN+CKTOT+MB+TROPI)
CK, MB: 155.8 ng/mL — ABNORMAL HIGH (ref 0.3–4.0)
CK, MB: 194.9 ng/mL — ABNORMAL HIGH (ref 0.3–4.0)
CK, MB: 26.1 ng/mL — ABNORMAL HIGH (ref 0.3–4.0)
Relative Index: 10.9 — ABNORMAL HIGH (ref 0.0–2.5)
Total CK: 1426 U/L — ABNORMAL HIGH (ref 7–232)
Total CK: 1627 U/L — ABNORMAL HIGH (ref 7–232)
Troponin I: 1.37 ng/mL (ref 0.00–0.06)
Troponin I: 29.34 ng/mL (ref 0.00–0.06)

## 2010-05-06 LAB — POCT I-STAT, CHEM 8
BUN: 18 mg/dL (ref 6–23)
Hemoglobin: 14.3 g/dL (ref 13.0–17.0)
Sodium: 136 mEq/L (ref 135–145)
TCO2: 21 mmol/L (ref 0–100)

## 2010-05-06 LAB — APTT: aPTT: 28 seconds (ref 24–37)

## 2010-07-04 ENCOUNTER — Encounter: Payer: Self-pay | Admitting: *Deleted

## 2010-07-04 ENCOUNTER — Ambulatory Visit (INDEPENDENT_AMBULATORY_CARE_PROVIDER_SITE_OTHER): Payer: PRIVATE HEALTH INSURANCE | Admitting: *Deleted

## 2010-07-04 DIAGNOSIS — I428 Other cardiomyopathies: Secondary | ICD-10-CM

## 2010-07-05 ENCOUNTER — Other Ambulatory Visit: Payer: Self-pay | Admitting: Internal Medicine

## 2010-07-09 ENCOUNTER — Encounter: Payer: Self-pay | Admitting: Cardiovascular Disease

## 2010-07-09 ENCOUNTER — Ambulatory Visit (INDEPENDENT_AMBULATORY_CARE_PROVIDER_SITE_OTHER): Payer: PRIVATE HEALTH INSURANCE | Admitting: Cardiovascular Disease

## 2010-07-09 VITALS — BP 134/81 | HR 69 | Resp 14 | Ht 71.0 in | Wt 211.0 lb

## 2010-07-09 DIAGNOSIS — I2589 Other forms of chronic ischemic heart disease: Secondary | ICD-10-CM

## 2010-07-09 DIAGNOSIS — I4891 Unspecified atrial fibrillation: Secondary | ICD-10-CM

## 2010-07-09 DIAGNOSIS — I2510 Atherosclerotic heart disease of native coronary artery without angina pectoris: Secondary | ICD-10-CM

## 2010-07-09 MED ORDER — CLOPIDOGREL BISULFATE 75 MG PO TABS: 75.0000 mg | ORAL_TABLET | Freq: Every day | ORAL | Status: DC

## 2010-07-09 NOTE — Progress Notes (Signed)
History of Present Illness:55 yo WM with history of tobacco abuse,  CAD who was admitted 01/22/09 with an acute anterior STEMI with thrombosis of the mid LAD stent placed in 2005. I placed a drug eluting stent in the mid LAD.  After his MI, his hospitalization was complicated by atrial fibrillation. He was in sinus rhythm at the time of discharge from the hospital. He was also found to have severe hypokinesis of the anterior wall on echo and a possible LV thrombus. He was started on coumadin with Lovenox bridge at discharge. He has been following in our coumadin clinic.  Cardiac MRI  With spring 2011 with  EF of 35% and full thickness scar in the apex, distal septum and inferoapex.He has been seen by  Dr. Ladona Ridgel and an ICD was placed in August 2011.  He is here today for follow up. No chest pain, SOB, palpitations, near syncope or syncope. He has been taking all meds. He feels great.   Past Medical History  Diagnosis Date  . Atrial fibrillation   . Ischemic cardiomyopathy   . Coronary atherosclerosis of native coronary artery     diagnosed 12/10   . Acute myocardial infarction of other anterior wall, episode of care unspecified   . LV (left ventricular) mural thrombus following MI   . ICD (implantable cardiac defibrillator) in place     Past Surgical History  Procedure Date  . Back surgery   . Vasectomy   . Knee surgery   . Shoulder surgery   . Cardiac defibrillator placement     [employer]    Current Outpatient Prescriptions  Medication Sig Dispense Refill  . aspirin EC 325 MG tablet Take 325 mg by mouth daily.        . dabigatran (PRADAXA) 150 MG CAPS Take 150 mg by mouth 2 (two) times daily.        Marland Kitchen lisinopril (PRINIVIL,ZESTRIL) 2.5 MG tablet Take 2.5 mg by mouth daily.        . metoprolol (LOPRESSOR) 50 MG tablet Take 50 mg by mouth 2 (two) times daily.        . nitroGLYCERIN (NITROSTAT) 0.4 MG SL tablet Place 0.4 mg under the tongue every 5 (five) minutes as needed.        .  simvastatin (ZOCOR) 40 MG tablet Take 40 mg by mouth at bedtime.        . prasugrel (EFFIENT) 10 MG TABS Take 10 mg by mouth daily.          No Known Allergies  History   Social History  . Marital Status: Married    Spouse Name: N/A    Number of Children: 2  . Years of Education: N/A   Occupational History  . Not on file.   Social History Main Topics  . Smoking status: Former Smoker    Quit date: 01/03/2009  . Smokeless tobacco: Not on file  . Alcohol Use: No  . Drug Use: No  . Sexually Active: Not on file   Other Topics Concern  . Not on file   Social History Narrative  . No narrative on file    Family History  Problem Relation Age of Onset  . Coronary artery disease Mother     Cabd  . Diabetes Mother   . Heart attack Father     died 75    Review of Systems:  As stated in the HPI and otherwise negative.   BP 134/81  Pulse 69  Resp 14  Ht 5\' 11"  (1.803 m)  Wt 211 lb (95.709 kg)  BMI 29.43 kg/m2  Physical Examination: General: Well developed, well nourished, NAD HEENT: OP clear, mucus membranes moist SKIN: warm, dry. No rashes. Neuro: No focal deficits Musculoskeletal: Muscle strength 5/5 all ext Psychiatric: Mood and affect normal Neck: No JVD, no carotid bruits, no thyromegaly, no lymphadenopathy. Lungs:Clear bilaterally, no wheezes, rhonci, crackles Cardiovascular: Regular rate and rhythm. No murmurs, gallops or rubs. Abdomen:Soft. Bowel sounds present. Non-tender.  Extremities: No lower extremity edema. Pulses are 2 + in the bilateral DP/PT.  EKG:NSR, rate 69 bpm. LAD. Poor R wave progression through the precordial leads.

## 2010-07-09 NOTE — Assessment & Plan Note (Signed)
Stable. ICD in place. Continue current therapy.

## 2010-07-09 NOTE — Assessment & Plan Note (Signed)
NSR today. Continue beta blocker and Pradaxa.

## 2010-07-09 NOTE — Assessment & Plan Note (Addendum)
Stable. Continue statin, ASA, beta blocker, Ace-inhibitor. Will change Effient to Plavix. Will check fasting lipids and LFTs at next visit.

## 2010-07-09 NOTE — Patient Instructions (Signed)
Your physician recommends that you schedule a follow-up appointment in:6 months with fasting blood work.  Your physician has recommended you make the following change in your medication: STOP EFFIENT and START PLAVIX 75 mg daily.   REMOTE DEVICE CHECK AUGUST 30TH @ 11:40 AM.

## 2010-07-10 NOTE — Progress Notes (Signed)
ICD REMOTE  

## 2010-07-29 ENCOUNTER — Other Ambulatory Visit: Payer: Self-pay | Admitting: Cardiovascular Disease

## 2010-08-28 ENCOUNTER — Telehealth: Payer: Self-pay | Admitting: Cardiovascular Disease

## 2010-08-28 NOTE — Telephone Encounter (Signed)
Pt called and stated he needs to speak to you regarding his last visit.  He was suppose to get back to you.

## 2010-08-28 NOTE — Telephone Encounter (Addendum)
Patient has questions regarding his bill for his ICD placement. This has been documented and was being handled by our coding and billing department. Apparently, he received an 18,000 dollar bill for his ICD since it was technically not approved by his insurance company since his EF was 35 % and has to be less than 35 %. He would like for me to discuss this with Dr. Clifton James and Ladona Ridgel as well as the pre-certifiers.

## 2010-08-29 NOTE — Telephone Encounter (Signed)
Spoke with Dr. Clifton James and I will route this note to Dr. Ladona Ridgel and his RN along with our coding and billing department for further review.

## 2010-09-10 NOTE — Telephone Encounter (Signed)
Will forward to Dr Ladona Ridgel  He has all the documents regarding patients case

## 2010-09-17 ENCOUNTER — Encounter: Payer: Self-pay | Admitting: Internal Medicine

## 2010-09-17 ENCOUNTER — Telehealth: Payer: Self-pay | Admitting: Internal Medicine

## 2010-09-17 NOTE — Telephone Encounter (Signed)
Spoke with patient and let him know Dr Ladona Ridgel would be writing another letter of medical necessity Trying to have his device covered  I have also left a message for [place] [phone] in regards to patient and not having to pay for the ICD at this time Dr Ladona Ridgel is trying to appeal the process This has been very stressful for him  We will fax in the 2nd appeal and I will also send copy to the patient

## 2010-09-17 NOTE — Telephone Encounter (Signed)
Pt called and wanted to know about a bill.  He had spoke to Dr. Clifton James nurse regarding this.  Please call him back.  No other info.

## 2010-10-03 ENCOUNTER — Ambulatory Visit (INDEPENDENT_AMBULATORY_CARE_PROVIDER_SITE_OTHER): Payer: PRIVATE HEALTH INSURANCE | Admitting: *Deleted

## 2010-10-03 DIAGNOSIS — I428 Other cardiomyopathies: Secondary | ICD-10-CM

## 2010-10-04 ENCOUNTER — Encounter: Payer: Self-pay | Admitting: Internal Medicine

## 2010-10-04 ENCOUNTER — Other Ambulatory Visit: Payer: Self-pay | Admitting: Internal Medicine

## 2010-10-04 LAB — REMOTE ICD DEVICE
HV IMPEDENCE: 52 Ohm
RV LEAD AMPLITUDE: 11.5 mv
RV LEAD IMPEDENCE ICD: 600 Ohm
VENTRICULAR PACING ICD: 1 pct

## 2010-10-10 ENCOUNTER — Other Ambulatory Visit: Payer: Self-pay | Admitting: Cardiovascular Disease

## 2010-10-11 ENCOUNTER — Other Ambulatory Visit: Payer: Self-pay | Admitting: Cardiovascular Disease

## 2010-10-13 NOTE — Telephone Encounter (Signed)
Letter dictated to insurance comission.

## 2010-10-16 ENCOUNTER — Encounter: Payer: Self-pay | Admitting: *Deleted

## 2010-10-18 NOTE — Progress Notes (Signed)
ICD checked by remote. 

## 2010-10-23 ENCOUNTER — Telehealth: Payer: Self-pay | Admitting: Internal Medicine

## 2010-10-23 NOTE — Telephone Encounter (Signed)
Letter mailed on 10/07/10

## 2010-10-23 NOTE — Telephone Encounter (Signed)
fax over letter that Dr Ladona Ridgel sent [phone]  # [phone]

## 2010-10-23 NOTE — Telephone Encounter (Signed)
Pt calling re sending second to letter re [employer] re denial on payment

## 2010-10-28 ENCOUNTER — Telehealth: Payer: Self-pay | Admitting: Internal Medicine

## 2010-10-28 NOTE — Telephone Encounter (Signed)
Please call about icd denial [place] [employer] [place] insurance department an appeal was denied

## 2010-11-21 ENCOUNTER — Telehealth: Payer: Self-pay | Admitting: Internal Medicine

## 2010-11-21 NOTE — Telephone Encounter (Signed)
Spoke with Jeremy Boyle, [facility], [phone], re:pt's account for ICD.  Appeal submitted 11-02-10 to insurance co.  Per Jeremy Boyle, [phone], ext [phone], [employer], it will take approximately 4-6 weeks for appeal processing and determination.  Relayed this information and Jeremy Boyle's phone# to [employer].  Olegario Messier will place hold on pt's acct as far as sending to collections until 02-03-11.  After that date pt's account will either be sent to collections or payment arrangement will need to be established.  In the interim, pt will still receive notices by mail but pt should hold these until final decision is made by insurance company.  Pt is to either call Olegario Messier or me (and I will then call Olegario Messier) in 30 days to update her on case progress.  Pt informed and understands.

## 2011-01-06 ENCOUNTER — Ambulatory Visit (INDEPENDENT_AMBULATORY_CARE_PROVIDER_SITE_OTHER): Payer: PRIVATE HEALTH INSURANCE | Admitting: Cardiovascular Disease

## 2011-01-06 ENCOUNTER — Encounter: Payer: Self-pay | Admitting: Cardiovascular Disease

## 2011-01-06 VITALS — BP 147/91 | HR 89 | Ht 71.0 in | Wt 220.1 lb

## 2011-01-06 DIAGNOSIS — E785 Hyperlipidemia, unspecified: Secondary | ICD-10-CM

## 2011-01-06 DIAGNOSIS — I2589 Other forms of chronic ischemic heart disease: Secondary | ICD-10-CM

## 2011-01-06 DIAGNOSIS — I10 Essential (primary) hypertension: Secondary | ICD-10-CM

## 2011-01-06 DIAGNOSIS — I2510 Atherosclerotic heart disease of native coronary artery without angina pectoris: Secondary | ICD-10-CM

## 2011-01-06 DIAGNOSIS — I238 Other current complications following acute myocardial infarction: Secondary | ICD-10-CM

## 2011-01-06 HISTORY — DX: Essential (primary) hypertension: I10

## 2011-01-06 MED ORDER — LISINOPRIL 5 MG PO TABS: 5.0000 mg | ORAL_TABLET | Freq: Every day | ORAL | Status: DC

## 2011-01-06 NOTE — Progress Notes (Signed)
History of Present Illness:55 yo WM with history of tobacco abuse, CAD who was admitted 01/22/09 with an acute anterior STEMI with thrombosis of the mid LAD stent placed in 2005. I placed a drug eluting stent in the mid LAD. After his MI, his hospitalization was complicated by atrial fibrillation. He was in sinus rhythm at the time of discharge from the hospital. He was also found to have severe hypokinesis of the anterior wall on echo and a possible LV thrombus. He was started on coumadin with Lovenox bridge at discharge. He was initially on coumadin but we switched to Pradaxa and then have chosen to continue this to limit the risk of LV thrombus given the fact that his anterior wall is akinetic.  Cardiac MRI in the spring of 2011 with EF of 35% and full thickness scar in the apex, distal septum and inferoapex.He has been seen by Dr. Ladona Ridgel and an ICD was placed in August 2011 given his risk profile for sudden cardiac death.   He is here today for follow up. No chest pain, SOB, palpitations, near syncope or syncope. He has been taking all meds. He feels great.    Past Medical History  Diagnosis Date  . Atrial fibrillation   . Ischemic cardiomyopathy   . Coronary atherosclerosis of native coronary artery     diagnosed 12/10   . Acute myocardial infarction of other anterior wall, episode of care unspecified   . LV (left ventricular) mural thrombus following MI   . ICD (implantable cardiac defibrillator) in place     Past Surgical History  Procedure Date  . Back surgery   . Vasectomy   . Knee surgery   . Shoulder surgery   . Cardiac defibrillator placement     St Jude    Current Outpatient Prescriptions  Medication Sig Dispense Refill  . aspirin EC 325 MG tablet Take 325 mg by mouth daily.        . clopidogrel (PLAVIX) 75 MG tablet Take 1 tablet (75 mg total) by mouth daily.  30 tablet  8  . dabigatran (PRADAXA) 150 MG CAPS Take 150 mg by mouth 2 (two) times daily.        .  metoprolol (LOPRESSOR) 50 MG tablet Take 50 mg by mouth 2 (two) times daily.        . nitroGLYCERIN (NITROSTAT) 0.4 MG SL tablet Place 0.4 mg under the tongue every 5 (five) minutes as needed.        Marland Kitchen ZESTRIL 2.5 MG tablet TAKE ONE TABLET BY MOUTH EVERY DAY  30 each  11  . ZOCOR 40 MG tablet TAKE ONE TABLET BY MOUTH AT BEDTIME  30 each  12    No Known Allergies  History   Social History  . Marital Status: Married    Spouse Name: N/A    Number of Children: 2  . Years of Education: N/A   Occupational History  . Not on file.   Social History Main Topics  . Smoking status: Former Smoker    Quit date: 01/03/2009  . Smokeless tobacco: Not on file  . Alcohol Use: No  . Drug Use: No  . Sexually Active: Not on file   Other Topics Concern  . Not on file   Social History Narrative  . No narrative on file    Family History  Problem Relation Age of Onset  . Coronary artery disease Mother     Cabd  . Diabetes Mother   .  Heart attack Father     died 28    Review of Systems:  As stated in the HPI and otherwise negative.   BP 147/91  Pulse 89  Ht 5\' 11"  (1.803 m)  Wt 220 lb 1.9 oz (99.846 kg)  BMI 30.70 kg/m2  Physical Examination: General: Well developed, well nourished, NAD HEENT: OP clear, mucus membranes moist SKIN: warm, dry. No rashes. Neuro: No focal deficits Musculoskeletal: Muscle strength 5/5 all ext Psychiatric: Mood and affect normal Neck: No JVD, no carotid bruits, no thyromegaly, no lymphadenopathy. Lungs:Clear bilaterally, no wheezes, rhonci, crackles Cardiovascular: Regular rate and rhythm. No murmurs, gallops or rubs. Abdomen:Soft. Bowel sounds present. Non-tender.  Extremities: No lower extremity edema. Pulses are 2 + in the bilateral DP/PT.

## 2011-01-06 NOTE — Patient Instructions (Signed)
Your physician wants you to follow-up in: 12 months.  You will receive a reminder letter in the mail two months in advance. If you don't receive a letter, please call our office to schedule the follow-up appointment.  Your physician recommends that you return for fasting lab work tomorrow--Lipid, Liver profile  Your physician has recommended you make the following change in your medication: Stop aspirin. Increase Zestril to 5 mg by mouth daily.

## 2011-01-06 NOTE — Assessment & Plan Note (Addendum)
Volume status is ok. He is on good medical therapy. He is on Pradaxa for his prior LV thrombus. This had resolved on f/u imaging but he is still high risk for LV apical thrombus given his akinetic anterior wall. Will continue Pradaxa. No evidence of bleeding.

## 2011-01-06 NOTE — Assessment & Plan Note (Signed)
See above. On Pradaxa.

## 2011-01-06 NOTE — Assessment & Plan Note (Signed)
Followed by Dr. Ladona Ridgel.  Appt in device clinic tomorrow.

## 2011-01-06 NOTE — Assessment & Plan Note (Signed)
BP is elevated today. Will increase Zestril to  5 mg po Qdaily.

## 2011-01-06 NOTE — Assessment & Plan Note (Signed)
Stable. He is having no symptoms consistent with angina. Continue Plavix, beta blocker, Ace-inhibitor. Will d/c ASA since he is on Pradaxa and Plavix.

## 2011-01-06 NOTE — Assessment & Plan Note (Signed)
Continue statin. Will check lipids and LFTs tomorrow.

## 2011-01-07 ENCOUNTER — Ambulatory Visit (INDEPENDENT_AMBULATORY_CARE_PROVIDER_SITE_OTHER): Payer: PRIVATE HEALTH INSURANCE | Admitting: Internal Medicine

## 2011-01-07 ENCOUNTER — Other Ambulatory Visit (INDEPENDENT_AMBULATORY_CARE_PROVIDER_SITE_OTHER): Payer: PRIVATE HEALTH INSURANCE | Admitting: *Deleted

## 2011-01-07 ENCOUNTER — Other Ambulatory Visit: Payer: Self-pay | Admitting: Cardiovascular Disease

## 2011-01-07 ENCOUNTER — Encounter: Payer: Self-pay | Admitting: Internal Medicine

## 2011-01-07 DIAGNOSIS — I2510 Atherosclerotic heart disease of native coronary artery without angina pectoris: Secondary | ICD-10-CM

## 2011-01-07 DIAGNOSIS — I2589 Other forms of chronic ischemic heart disease: Secondary | ICD-10-CM

## 2011-01-07 DIAGNOSIS — Z95810 Presence of automatic (implantable) cardiac defibrillator: Secondary | ICD-10-CM

## 2011-01-07 DIAGNOSIS — E785 Hyperlipidemia, unspecified: Secondary | ICD-10-CM

## 2011-01-07 LAB — HEPATIC FUNCTION PANEL
AST: 48 U/L — ABNORMAL HIGH (ref 0–37)
Alkaline Phosphatase: 70 U/L (ref 39–117)
Total Bilirubin: 0.6 mg/dL (ref 0.3–1.2)

## 2011-01-07 LAB — ICD DEVICE OBSERVATION
BRDY-0002RV: 40 {beats}/min
FVT: 0
RV LEAD AMPLITUDE: 12 mv
RV LEAD IMPEDENCE ICD: 700 Ohm
RV LEAD THRESHOLD: 0.75 V
VF: 0

## 2011-01-07 LAB — LIPID PANEL
Total CHOL/HDL Ratio: 5
Triglycerides: 250 mg/dL — ABNORMAL HIGH (ref 0.0–149.0)

## 2011-01-07 NOTE — Assessment & Plan Note (Signed)
He denies anginal symptoms. He will continue his current medical therapy. 

## 2011-01-07 NOTE — Assessment & Plan Note (Signed)
His device is working normally. He has had no ICD shocks. We'll plan to recheck in several months.

## 2011-01-07 NOTE — Patient Instructions (Signed)
Your physician wants you to follow-up in: 12 months with Dr. Taylor. You will receive a reminder letter in the mail two months in advance. If you don't receive a letter, please call our office to schedule the follow-up appointment.    

## 2011-01-07 NOTE — Assessment & Plan Note (Signed)
He will continue his current medical therapy. Today he is to undergo fasting lipids and a liver panel.

## 2011-01-07 NOTE — Progress Notes (Signed)
HPI [name] returns today for followup. He is a very pleasant 55 year old man with an ischemic cardiomyopathy status post MI x2 with an ejection fraction of 35%, chronic class II systolic heart failure, hypertension, and dyslipidemia. The patient denies chest pain, syncope, or peripheral edema. He has had no ICD shock. His heart failure is stable class II. He is trying to maintain a low-sodium diet. No Known Allergies   Current Outpatient Prescriptions  Medication Sig Dispense Refill  . clopidogrel (PLAVIX) 75 MG tablet Take 1 tablet (75 mg total) by mouth daily.  30 tablet  8  . dabigatran (PRADAXA) 150 MG CAPS Take 150 mg by mouth 2 (two) times daily.        Marland Kitchen lisinopril (ZESTRIL) 5 MG tablet Take 1 tablet (5 mg total) by mouth daily.  30 tablet  11  . metoprolol (LOPRESSOR) 50 MG tablet Take 50 mg by mouth 2 (two) times daily.        . nitroGLYCERIN (NITROSTAT) 0.4 MG SL tablet Place 0.4 mg under the tongue every 5 (five) minutes as needed.        Marland Kitchen ZOCOR 40 MG tablet TAKE ONE TABLET BY MOUTH AT BEDTIME  30 each  12     Past Medical History  Diagnosis Date  . Atrial fibrillation   . Ischemic cardiomyopathy   . Coronary atherosclerosis of native coronary artery     diagnosed 12/10   . Acute myocardial infarction of other anterior wall, episode of care unspecified   . LV (left ventricular) mural thrombus following MI   . ICD (implantable cardiac defibrillator) in place     ROS:   All systems reviewed and negative except as noted in the HPI.   Past Surgical History  Procedure Date  . Back surgery   . Vasectomy   . Knee surgery   . Shoulder surgery   . Cardiac defibrillator placement     St Jude     Family History  Problem Relation Age of Onset  . Coronary artery disease Mother     Cabd  . Diabetes Mother   . Heart attack Father     died 49     History   Social History  . Marital Status: Married    Spouse Name: N/A    Number of Children: 2  . Years of  Education: N/A   Occupational History  . Not on file.   Social History Main Topics  . Smoking status: Former Smoker    Quit date: 01/03/2009  . Smokeless tobacco: Not on file  . Alcohol Use: No  . Drug Use: No  . Sexually Active: Not on file   Other Topics Concern  . Not on file   Social History Narrative  . No narrative on file     BP 132/76  Pulse 73  Ht 5\' 11"  (1.803 m)  Wt 90.719 kg (200 lb)  BMI 27.89 kg/m2  Physical Exam:  Well appearing NAD HEENT: Unremarkable Neck:  No JVD, no thyromegally Lungs:  Clear with no wheezes, rales, or rhonchi. Well-healed ICD incision. HEART:  Regular rate rhythm, no murmurs, no rubs, no clicks Abd:  soft, positive bowel sounds, no organomegally, no rebound, no guarding Ext:  2 plus pulses, no edema, no cyanosis, no clubbing Skin:  No rashes no nodules Neuro:  CN II through XII intact, motor grossly intact  DEVICE  Normal device function.  See PaceArt for details.   Assess/Plan:

## 2011-01-08 LAB — LDL CHOLESTEROL, DIRECT: Direct LDL: 113.4 mg/dL

## 2011-01-09 ENCOUNTER — Other Ambulatory Visit: Payer: Self-pay | Admitting: *Deleted

## 2011-01-09 ENCOUNTER — Other Ambulatory Visit: Payer: Self-pay | Admitting: Cardiovascular Disease

## 2011-01-09 DIAGNOSIS — E7800 Pure hypercholesterolemia, unspecified: Secondary | ICD-10-CM

## 2011-01-09 DIAGNOSIS — E785 Hyperlipidemia, unspecified: Secondary | ICD-10-CM

## 2011-01-09 MED ORDER — ROSUVASTATIN CALCIUM 20 MG PO TABS: 20.0000 mg | ORAL_TABLET | Freq: Every day | ORAL | Status: DC

## 2011-01-10 MED ORDER — ATORVASTATIN CALCIUM 40 MG PO TABS: 40.0000 mg | ORAL_TABLET | Freq: Every day | ORAL | Status: DC

## 2011-01-10 NOTE — Telephone Encounter (Signed)
I spoke with the patient. He states he did pick up the new prescription for Crestor ( just changed from simvastatin), but it was $160. He states he cannot afford that and was wanting to switch possibly to Lipitor since it has a generic. He has not been on any other statins before. I explained we would review with Dr. Clifton James and call him back. He has already picked up the crestor, so I have advised he go ahead and take what he has since it can't be returned to the pharmacy He verbalizes understanding and will do that. He will await a call back from [facility] after reviewed with Dr. Clifton James.

## 2011-01-10 NOTE — Telephone Encounter (Signed)
New problem Pt wants to talk to you about crestor please call

## 2011-01-10 NOTE — Telephone Encounter (Signed)
The patient is aware of Dr. Gibson Ramp recommendations. RX will be sent to the patient's pharmacy per his request.

## 2011-01-10 NOTE — Telephone Encounter (Signed)
We can switch him to atorvastatin 40 mg po QHS.  Thanks, chris

## 2011-01-27 ENCOUNTER — Encounter: Payer: Self-pay | Admitting: Internal Medicine

## 2011-02-13 ENCOUNTER — Telehealth: Payer: Self-pay | Admitting: Cardiovascular Disease

## 2011-02-13 NOTE — Telephone Encounter (Signed)
New Problem:    Patient called in, wanting to speak directly with you, having a few questions about being a Dr. Clifton James patient.  He claims it is not a medical emergency and could wait to speak with you.  Please call back.

## 2011-02-17 NOTE — Telephone Encounter (Signed)
We do not have any Pradaxa samples in office

## 2011-02-17 NOTE — Telephone Encounter (Signed)
Pt states he is unable to walk much due to pain in his knees. Previously saw MD in [place] for this and would like recommendation from Dr. Clifton James for ortho doctor to see here.  Also asking for samples of Pradaxa. He is coming in to office later this week for lab work.  I told him I would leave samples at desk if we had any

## 2011-02-18 ENCOUNTER — Telehealth: Payer: Self-pay | Admitting: Cardiovascular Disease

## 2011-02-18 NOTE — Telephone Encounter (Signed)
Spoke with patient and gave him recommendation from Dr. Clifton James

## 2011-02-18 NOTE — Telephone Encounter (Signed)
Left message to call back  

## 2011-02-18 NOTE — Telephone Encounter (Signed)
I think Trudee Grip does a lot of knees here in town. chris

## 2011-02-18 NOTE — Telephone Encounter (Signed)
FU Call: Pt calling to speak with Pat. Please return pt call to discuss further.

## 2011-02-18 NOTE — Telephone Encounter (Signed)
Spoke with pt and Dr. Despina Hick is not covered by his insurance. He states he will make appt with primary care provider he saw in the past for evaluation and referral.

## 2011-02-19 ENCOUNTER — Other Ambulatory Visit (INDEPENDENT_AMBULATORY_CARE_PROVIDER_SITE_OTHER): Payer: PRIVATE HEALTH INSURANCE | Admitting: *Deleted

## 2011-02-19 DIAGNOSIS — E785 Hyperlipidemia, unspecified: Secondary | ICD-10-CM

## 2011-02-19 LAB — LIPID PANEL: VLDL: 38.6 mg/dL (ref 0.0–40.0)

## 2011-02-19 LAB — HEPATIC FUNCTION PANEL
AST: 29 U/L (ref 0–37)
Albumin: 4.3 g/dL (ref 3.5–5.2)
Alkaline Phosphatase: 70 U/L (ref 39–117)
Total Protein: 7.1 g/dL (ref 6.0–8.3)

## 2011-02-26 ENCOUNTER — Other Ambulatory Visit: Payer: Self-pay | Admitting: Cardiovascular Disease

## 2011-04-10 ENCOUNTER — Encounter: Payer: Self-pay | Admitting: Internal Medicine

## 2011-04-10 ENCOUNTER — Ambulatory Visit (INDEPENDENT_AMBULATORY_CARE_PROVIDER_SITE_OTHER): Payer: PRIVATE HEALTH INSURANCE | Admitting: *Deleted

## 2011-04-10 DIAGNOSIS — I4891 Unspecified atrial fibrillation: Secondary | ICD-10-CM

## 2011-04-10 DIAGNOSIS — Z95810 Presence of automatic (implantable) cardiac defibrillator: Secondary | ICD-10-CM

## 2011-04-10 DIAGNOSIS — I428 Other cardiomyopathies: Secondary | ICD-10-CM

## 2011-04-10 DIAGNOSIS — I509 Heart failure, unspecified: Secondary | ICD-10-CM

## 2011-04-11 LAB — REMOTE ICD DEVICE
BRDY-0002RV: 40 {beats}/min
DEVICE MODEL ICD: 777665
RV LEAD AMPLITUDE: 11.5 mv
VENTRICULAR PACING ICD: 1 pct

## 2011-04-16 NOTE — Progress Notes (Signed)
Remote icd check w/icm  

## 2011-05-02 ENCOUNTER — Other Ambulatory Visit: Payer: Self-pay | Admitting: Cardiovascular Disease

## 2011-05-05 ENCOUNTER — Encounter: Payer: Self-pay | Admitting: *Deleted

## 2011-05-05 NOTE — Telephone Encounter (Signed)
Sent to incorrect pool  °

## 2011-05-05 NOTE — Telephone Encounter (Signed)
Pt calling back to see if rx can be called in today, pt out

## 2011-07-10 ENCOUNTER — Ambulatory Visit (INDEPENDENT_AMBULATORY_CARE_PROVIDER_SITE_OTHER): Payer: PRIVATE HEALTH INSURANCE | Admitting: *Deleted

## 2011-07-10 ENCOUNTER — Encounter: Payer: Self-pay | Admitting: Internal Medicine

## 2011-07-10 DIAGNOSIS — I2589 Other forms of chronic ischemic heart disease: Secondary | ICD-10-CM

## 2011-07-10 DIAGNOSIS — Z95810 Presence of automatic (implantable) cardiac defibrillator: Secondary | ICD-10-CM

## 2011-07-11 LAB — REMOTE ICD DEVICE
BRDY-0002RV: 40 {beats}/min
RV LEAD AMPLITUDE: 12 mv

## 2011-07-28 ENCOUNTER — Encounter: Payer: Self-pay | Admitting: *Deleted

## 2011-08-01 ENCOUNTER — Other Ambulatory Visit: Payer: Self-pay | Admitting: Cardiovascular Disease

## 2011-09-01 ENCOUNTER — Other Ambulatory Visit: Payer: Self-pay | Admitting: Cardiovascular Disease

## 2011-09-08 ENCOUNTER — Other Ambulatory Visit: Payer: Self-pay | Admitting: Cardiovascular Disease

## 2011-10-16 ENCOUNTER — Encounter: Payer: PRIVATE HEALTH INSURANCE | Admitting: *Deleted

## 2011-10-21 ENCOUNTER — Encounter: Payer: Self-pay | Admitting: *Deleted

## 2011-10-28 ENCOUNTER — Telehealth: Payer: Self-pay | Admitting: Internal Medicine

## 2011-10-28 NOTE — Telephone Encounter (Signed)
N/A line busy/kwm

## 2011-10-28 NOTE — Telephone Encounter (Signed)
PT CALLING RE LETTER THAT HE DIDN'T DO REMOTE 9-12, HE CHANGED PHONE SERVICE AND DOES NOT HAVE TRANSMITTER HOOKED UP , HAS APPT IN [place]

## 2011-11-03 NOTE — Telephone Encounter (Signed)
Pt seeing Dr Ladona Ridgel on 01-13-12. Device will be checked then. Pt bringing back transmitter on 11-21-11 with appt.

## 2011-11-21 ENCOUNTER — Encounter: Payer: Self-pay | Admitting: Cardiovascular Disease

## 2011-11-21 ENCOUNTER — Ambulatory Visit (INDEPENDENT_AMBULATORY_CARE_PROVIDER_SITE_OTHER): Payer: PRIVATE HEALTH INSURANCE | Admitting: Cardiovascular Disease

## 2011-11-21 VITALS — BP 128/80 | HR 64 | Ht 71.0 in | Wt 228.0 lb

## 2011-11-21 DIAGNOSIS — Z01810 Encounter for preprocedural cardiovascular examination: Secondary | ICD-10-CM

## 2011-11-21 DIAGNOSIS — I219 Acute myocardial infarction, unspecified: Secondary | ICD-10-CM

## 2011-11-21 DIAGNOSIS — I2589 Other forms of chronic ischemic heart disease: Secondary | ICD-10-CM

## 2011-11-21 DIAGNOSIS — I2510 Atherosclerotic heart disease of native coronary artery without angina pectoris: Secondary | ICD-10-CM

## 2011-11-21 DIAGNOSIS — I513 Intracardiac thrombosis, not elsewhere classified: Secondary | ICD-10-CM

## 2011-11-21 DIAGNOSIS — I255 Ischemic cardiomyopathy: Secondary | ICD-10-CM

## 2011-11-21 NOTE — Patient Instructions (Addendum)
Your physician wants you to follow-up in:  12 months.  You will receive a reminder letter in the mail two months in advance. If you don't receive a letter, please call our office to schedule the follow-up appointment.   

## 2011-11-21 NOTE — Progress Notes (Signed)
History of Present Illness: 56 yo WM with history of tobacco abuse, CAD, MI who was admitted 01/22/09 with an acute anterior STEMI with thrombosis of the mid LAD stent placed in 2005. I placed a drug eluting stent in the mid LAD. After his MI, his hospitalization was complicated by atrial fibrillation. He was in sinus rhythm at the time of discharge from the hospital. He was also found to have severe hypokinesis of the anterior wall on echo and a possible LV thrombus. He was started on coumadin with Lovenox bridge at discharge. He was initially on coumadin but we switched to Pradaxa and then have chosen to continue this to limit the risk of LV thrombus given the fact that his anterior wall is akinetic. Cardiac MRI in the spring of 2011 with EF of 35% and full thickness scar in the apex, distal septum and inferoapex.He has been seen by Dr. Ladona Ridgel and an ICD was placed in August 2011 given his risk profile for sudden cardiac death. He has been followed in our device clinic by Dr. Ladona Ridgel.   He is here today for follow up. No chest pain, SOB, palpitations, near syncope or syncope. He has been taking all meds. He feels great. He is hoping to have his left knee replaced.   Primary Care Physician: Shary Decamp  Last Lipid Profile:Lipid Panel     Component Value Date/Time   CHOL 154 02/19/2011 0947   TRIG 193.0* 02/19/2011 0947   HDL 38.60* 02/19/2011 0947   CHOLHDL 4 02/19/2011 0947   VLDL 38.6 02/19/2011 0947   LDLCALC 77 02/19/2011 0947     Past Medical History  Diagnosis Date  . Atrial fibrillation   . Ischemic cardiomyopathy   . Coronary atherosclerosis of native coronary artery     diagnosed 12/10   . Acute myocardial infarction of other anterior wall, episode of care unspecified   . LV (left ventricular) mural thrombus following MI   . ICD (implantable cardiac defibrillator) in place     Past Surgical History  Procedure Date  . Back surgery   . Vasectomy   . Knee surgery   . Shoulder surgery    . Cardiac defibrillator placement     St Jude    Current Outpatient Prescriptions  Medication Sig Dispense Refill  . LIPITOR 40 MG tablet TAKE 1 TABLET DAILY  30 each  6  . lisinopril (ZESTRIL) 5 MG tablet Take 1 tablet (5 mg total) by mouth daily.  30 tablet  11  . LOPRESSOR 50 MG tablet TAKE ONE TABLET BY MOUTH 2 TIMES A DAY  60 each  9  . nitroGLYCERIN (NITROSTAT) 0.4 MG SL tablet Place 0.4 mg under the tongue every 5 (five) minutes as needed.        Marland Kitchen PLAVIX 75 MG tablet Take 1 tablet (75 mg total) by mouth daily.  30 each  6  . PRADAXA 150 MG CAPS TAKE ONE CAPSULE BY MOUTH 2 TIMES A DAY  180 capsule  3    No Known Allergies  History   Social History  . Marital Status: Married    Spouse Name: N/A    Number of Children: 2  . Years of Education: N/A   Occupational History  . Not on file.   Social History Main Topics  . Smoking status: Former Smoker    Quit date: 01/03/2009  . Smokeless tobacco: Not on file  . Alcohol Use: No  . Drug Use: No  . Sexually Active: Not  on file   Other Topics Concern  . Not on file   Social History Narrative  . No narrative on file    Family History  Problem Relation Age of Onset  . Coronary artery disease Mother     Cabd  . Diabetes Mother   . Heart attack Father     died 38    Review of Systems:  As stated in the HPI and otherwise negative.   BP 128/80  Pulse 64  Ht 5\' 11"  (1.803 m)  Wt 228 lb (103.42 kg)  BMI 31.80 kg/m2  Physical Examination: General: Well developed, well nourished, NAD HEENT: OP clear, mucus membranes moist SKIN: warm, dry. No rashes. Neuro: No focal deficits Musculoskeletal: Muscle strength 5/5 all ext Psychiatric: Mood and affect normal Neck: No JVD, no carotid bruits, no thyromegaly, no lymphadenopathy. Lungs:Clear bilaterally, no wheezes, rhonci, crackles Cardiovascular: Regular rate and rhythm. No murmurs, gallops or rubs. Abdomen:Soft. Bowel sounds present. Non-tender.  Extremities: No  lower extremity edema. Pulses are 2 + in the bilateral DP/PT.  EKG: NSR, rate 64 bpm.   Assessment and Plan:   1. CAD: Stable No changes. Continue Plavix, statin, beta blocker, Ace-inhibitor. No ASA secondary to need for Pradaxa and Plavix.   2. Ischemic Cardiomyopathy: ICD in place. Volume status is ok. He is on good medical therapy. No changes.   3. MURAL THROMBUS, APEX OF HEART : See above. On Pradaxa. He has chosen to continue for now with akinetic apex and risk of future thrombus. OK to hold Pradaxa for planned knee replacement.   4. HTN: BP controlled. No changes  5. HYPERLIPIDEMIA: Continue statin. Lipids are well controlled.   6. Pre-operative cardiovascular examination: He is doing well from a cardiac standpoint. No angina or CHF symptoms. No further cardiac workup needed before his planned knee replacement. Will need to hold Pradaxa for at least 3 days before his surgery but would recommend 5 days before surgery. Restart when safe from a surgical perspective. OK to hold Plavix for 5 days before surgery.

## 2012-01-08 ENCOUNTER — Encounter: Payer: Self-pay | Admitting: *Deleted

## 2012-01-08 ENCOUNTER — Ambulatory Visit: Payer: PRIVATE HEALTH INSURANCE | Admitting: Cardiovascular Disease

## 2012-01-09 ENCOUNTER — Other Ambulatory Visit: Payer: Self-pay | Admitting: Cardiovascular Disease

## 2012-01-13 ENCOUNTER — Encounter: Payer: PRIVATE HEALTH INSURANCE | Admitting: Internal Medicine

## 2012-01-17 ENCOUNTER — Other Ambulatory Visit: Payer: Self-pay | Admitting: Cardiovascular Disease

## 2012-02-10 ENCOUNTER — Ambulatory Visit (INDEPENDENT_AMBULATORY_CARE_PROVIDER_SITE_OTHER): Payer: BC Managed Care – PPO | Admitting: Internal Medicine

## 2012-02-10 ENCOUNTER — Encounter: Payer: Self-pay | Admitting: Internal Medicine

## 2012-02-10 VITALS — BP 131/95 | HR 98 | Ht 71.0 in | Wt 218.0 lb

## 2012-02-10 DIAGNOSIS — I2589 Other forms of chronic ischemic heart disease: Secondary | ICD-10-CM

## 2012-02-10 DIAGNOSIS — Z95810 Presence of automatic (implantable) cardiac defibrillator: Secondary | ICD-10-CM

## 2012-02-10 LAB — ICD DEVICE OBSERVATION
BRDY-0002RV: 40 {beats}/min
DEV-0020ICD: NEGATIVE
DEVICE MODEL ICD: 777665
VENTRICULAR PACING ICD: 1 pct

## 2012-02-10 NOTE — Progress Notes (Signed)
HPI [name] returns today for followup. He is a 57 year old man with coronary artery disease status post MI, chronic systolic heart failure, ejection fraction 35%, ischemic cardiomyopathy, status post ICD implantation. In the interim, he has done well. He notes a recent knee replacement. He still has some residual pain. He denies chest pain or shortness of breath. He has stopped smoking and has cut way back on his alcohol consumption. No Known Allergies   Current Outpatient Prescriptions  Medication Sig Dispense Refill  . clopidogrel (PLAVIX) 75 MG tablet Take 1 tablet (75 mg total) by mouth daily.  30 tablet  6  . HYDROcodone-acetaminophen (NORCO/VICODIN) 5-325 MG per tablet Take 1 tablet by mouth every 6 (six) hours as needed. For pain      . LIPITOR 40 MG tablet TAKE 1 TABLET DAILY  30 each  6  . lisinopril (PRINIVIL,ZESTRIL) 5 MG tablet TAKE ONE TABLET BY MOUTH EVERY DAY  30 tablet  6  . LOPRESSOR 50 MG tablet TAKE ONE TABLET BY MOUTH 2 TIMES A DAY  60 each  9  . nitroGLYCERIN (NITROSTAT) 0.4 MG SL tablet Place 0.4 mg under the tongue every 5 (five) minutes as needed.        Marland Kitchen PRADAXA 150 MG CAPS TAKE ONE CAPSULE BY MOUTH 2 TIMES A DAY  180 capsule  3     Past Medical History  Diagnosis Date  . Atrial fibrillation   . Ischemic cardiomyopathy   . Coronary atherosclerosis of native coronary artery     diagnosed 12/10   . Acute myocardial infarction of other anterior wall, episode of care unspecified   . LV (left ventricular) mural thrombus following MI   . ICD (implantable cardiac defibrillator) in place     ROS:   All systems reviewed and negative except as noted in the HPI.   Past Surgical History  Procedure Date  . Back surgery   . Vasectomy   . Knee surgery   . Shoulder surgery   . Cardiac defibrillator placement     St Jude     Family History  Problem Relation Age of Onset  . Coronary artery disease Mother     Cabd  . Diabetes Mother   . Heart attack  Father     died 28     History   Social History  . Marital Status: Married    Spouse Name: N/A    Number of Children: 2  . Years of Education: N/A   Occupational History  . Not on file.   Social History Main Topics  . Smoking status: Former Smoker    Quit date: 01/03/2009  . Smokeless tobacco: Not on file  . Alcohol Use: No  . Drug Use: No  . Sexually Active: Not on file   Other Topics Concern  . Not on file   Social History Narrative  . No narrative on file     BP 131/95  Pulse 98  Ht 5\' 11"  (1.803 m)  Wt 218 lb (98.884 kg)  BMI 30.40 kg/m2  Physical Exam:  Well appearing middle-aged man, NAD HEENT: Unremarkable Neck:  No JVD, no thyromegally Lungs:  Clear with no wheezes, rales, or rhonchi. HEART:  Regular rate rhythm, no murmurs, no rubs, no clicks Abd:  soft, positive bowel sounds, no organomegally, no rebound, no guarding Ext:  2 plus pulses, no edema, no cyanosis, no clubbing Skin:  No rashes no nodules Neuro:  CN II through XII intact, motor grossly  intact  DEVICE  Normal device function.  See PaceArt for details.   Assess/Plan:

## 2012-02-10 NOTE — Patient Instructions (Signed)
Your physician wants you to follow-up in: 6 months with Dr Taylor You will receive a reminder letter in the mail two months in advance. If you don't receive a letter, please call our office to schedule the follow-up appointment.  

## 2012-02-10 NOTE — Assessment & Plan Note (Signed)
The patient denies anginal symptoms. I've encouraged the patient to increase his physical activity and maintain a low-sodium diet. No change in medical therapy.

## 2012-02-10 NOTE — Assessment & Plan Note (Signed)
His St. Jude ICD is working normally. We'll plan to recheck in several months. 

## 2012-05-05 ENCOUNTER — Other Ambulatory Visit: Payer: Self-pay | Admitting: Cardiovascular Disease

## 2012-07-02 ENCOUNTER — Other Ambulatory Visit: Payer: Self-pay | Admitting: Cardiovascular Disease

## 2012-08-10 ENCOUNTER — Encounter: Payer: Self-pay | Admitting: Internal Medicine

## 2012-08-10 ENCOUNTER — Ambulatory Visit (INDEPENDENT_AMBULATORY_CARE_PROVIDER_SITE_OTHER): Payer: BC Managed Care – PPO | Admitting: Internal Medicine

## 2012-08-10 VITALS — BP 128/82 | HR 81 | Ht 71.0 in | Wt 222.0 lb

## 2012-08-10 DIAGNOSIS — Z95810 Presence of automatic (implantable) cardiac defibrillator: Secondary | ICD-10-CM

## 2012-08-10 DIAGNOSIS — I2589 Other forms of chronic ischemic heart disease: Secondary | ICD-10-CM

## 2012-08-10 DIAGNOSIS — I4891 Unspecified atrial fibrillation: Secondary | ICD-10-CM

## 2012-08-10 NOTE — Patient Instructions (Addendum)
**Note De-identified Jeremy Boyle Obfuscation** Your physician recommends that you continue on your current medications as directed. Please refer to the Current Medication list given to you today.  Your physician wants you to follow-up in: 1 year. You will receive a reminder letter in the mail two months in advance. If you don't receive a letter, please call our office to schedule the follow-up appointment.  

## 2012-08-10 NOTE — Progress Notes (Signed)
HPI [name] returns today for followup. He is a pleasant 57 year old man with an ischemic cardiomyopathy, status post anterior myocardial infarction in 2005, with stent restenosis in 2010, status post insertion of a drug-eluting stent at that time. He has some point developed severe left ventricular dysfunction, and paroxysmal atrial fibrillation. He is status post ICD implantation. He is maintained on anticoagulation with both Plavix and Pradaxa. He has had no bleeding. He denies easy bruisability. No chest pain. He has mild dyspnea with exertion. No peripheral edema. He remains active. No Known Allergies   Current Outpatient Prescriptions  Medication Sig Dispense Refill  . atorvastatin (LIPITOR) 40 MG tablet TAKE 1 TABLET DAILY  30 tablet  5  . clopidogrel (PLAVIX) 75 MG tablet Take 1 tablet (75 mg total) by mouth daily.  30 tablet  6  . lisinopril (PRINIVIL,ZESTRIL) 5 MG tablet TAKE ONE TABLET BY MOUTH EVERY DAY  30 tablet  6  . LOPRESSOR 50 MG tablet TAKE ONE TABLET BY MOUTH 2 TIMES A DAY  60 each  9  . nitroGLYCERIN (NITROSTAT) 0.4 MG SL tablet Place 0.4 mg under the tongue every 5 (five) minutes as needed.        Marland Kitchen PRADAXA 150 MG CAPS TAKE ONE CAPSULE BY MOUTH 2 TIMES A DAY  180 capsule  3   No current facility-administered medications for this visit.     Past Medical History  Diagnosis Date  . Atrial fibrillation   . Ischemic cardiomyopathy   . Coronary atherosclerosis of native coronary artery     diagnosed 12/10   . Acute myocardial infarction of other anterior wall, episode of care unspecified   . LV (left ventricular) mural thrombus following MI   . ICD (implantable cardiac defibrillator) in place     ROS:   All systems reviewed and negative except as noted in the HPI.   Past Surgical History  Procedure Laterality Date  . Back surgery    . Vasectomy    . Knee surgery    . Shoulder surgery    . Cardiac defibrillator placement      St Jude     Family History   Problem Relation Age of Onset  . Coronary artery disease Mother     Cabd  . Diabetes Mother   . Heart attack Father     died 60     History   Social History  . Marital Status: Married    Spouse Name: N/A    Number of Children: 2  . Years of Education: N/A   Occupational History  . Not on file.   Social History Main Topics  . Smoking status: Former Smoker    Quit date: 01/03/2009  . Smokeless tobacco: Not on file  . Alcohol Use: No  . Drug Use: No  . Sexually Active: Not on file   Other Topics Concern  . Not on file   Social History Narrative  . No narrative on file     BP 128/82  Pulse 81  Ht 5\' 11"  (1.803 m)  Wt 222 lb (100.699 kg)  BMI 30.98 kg/m2  Physical Exam:  Well appearing middle-aged man, NAD HEENT: Unremarkable Neck:  7 cm JVD, no thyromegally Lungs:  Clear with no wheezes, rales, or rhonchi. HEART:  Regular rate rhythm, no murmurs, no rubs, no clicks Abd:  soft, positive bowel sounds, no organomegally, no rebound, no guarding Ext:  2 plus pulses, no edema, no cyanosis, no clubbing Skin:  No rashes  no nodules Neuro:  CN II through XII intact, motor grossly intact  DEVICE  Normal device function.  See PaceArt for details.   Assess/Plan:

## 2012-08-10 NOTE — Assessment & Plan Note (Signed)
The patient is still on combination of Plavix and Pradaxa. I discussed the treatment options with his primary cardiologist. One option would be to continue Pradaxa and discontinue Plavix and starting low-dose aspirin. However the patient has stent restenosis late after intervention and has done well in the interim. After careful discussion of the risk and benefits of both approaches, he elects to continue his current medical therapy with Plavix and Pradaxa.

## 2012-08-10 NOTE — Assessment & Plan Note (Signed)
His St. Jude ICD is working normally. We'll plan to recheck in several months. 

## 2012-08-10 NOTE — Assessment & Plan Note (Signed)
He appears to be maintaining sinus rhythm. We'll continue his anticoagulation.

## 2012-08-11 ENCOUNTER — Other Ambulatory Visit: Payer: Self-pay | Admitting: Cardiovascular Disease

## 2012-08-11 LAB — ICD DEVICE OBSERVATION
BRDY-0002RV: 40 {beats}/min
CHARGE TIME: 8.9 s
DEV-0020ICD: NEGATIVE
DEVICE MODEL ICD: 777665
HV IMPEDENCE: 62 Ohm
RV LEAD AMPLITUDE: 11.5 mv
TOT-0007: 1
TOT-0009: 1
TOT-0010: 11
VENTRICULAR PACING ICD: 0 pct
VF: 0

## 2012-11-01 ENCOUNTER — Other Ambulatory Visit: Payer: Self-pay | Admitting: Cardiovascular Disease

## 2012-12-03 ENCOUNTER — Ambulatory Visit: Payer: BC Managed Care – PPO | Admitting: Cardiovascular Disease

## 2012-12-08 ENCOUNTER — Ambulatory Visit (INDEPENDENT_AMBULATORY_CARE_PROVIDER_SITE_OTHER): Payer: BC Managed Care – PPO | Admitting: *Deleted

## 2012-12-08 ENCOUNTER — Encounter: Payer: Self-pay | Admitting: Cardiovascular Disease

## 2012-12-08 ENCOUNTER — Ambulatory Visit (INDEPENDENT_AMBULATORY_CARE_PROVIDER_SITE_OTHER): Payer: BC Managed Care – PPO | Admitting: Cardiovascular Disease

## 2012-12-08 VITALS — BP 142/88 | HR 69 | Ht 71.0 in | Wt 221.4 lb

## 2012-12-08 DIAGNOSIS — I255 Ischemic cardiomyopathy: Secondary | ICD-10-CM

## 2012-12-08 DIAGNOSIS — I428 Other cardiomyopathies: Secondary | ICD-10-CM

## 2012-12-08 DIAGNOSIS — I2589 Other forms of chronic ischemic heart disease: Secondary | ICD-10-CM

## 2012-12-08 DIAGNOSIS — I10 Essential (primary) hypertension: Secondary | ICD-10-CM

## 2012-12-08 DIAGNOSIS — I513 Intracardiac thrombosis, not elsewhere classified: Secondary | ICD-10-CM

## 2012-12-08 DIAGNOSIS — E785 Hyperlipidemia, unspecified: Secondary | ICD-10-CM

## 2012-12-08 DIAGNOSIS — I4891 Unspecified atrial fibrillation: Secondary | ICD-10-CM

## 2012-12-08 DIAGNOSIS — I219 Acute myocardial infarction, unspecified: Secondary | ICD-10-CM

## 2012-12-08 DIAGNOSIS — I2510 Atherosclerotic heart disease of native coronary artery without angina pectoris: Secondary | ICD-10-CM

## 2012-12-08 DIAGNOSIS — Z95810 Presence of automatic (implantable) cardiac defibrillator: Secondary | ICD-10-CM

## 2012-12-08 LAB — ICD DEVICE OBSERVATION
BRDY-0002RV: 40 {beats}/min
DEV-0020ICD: NEGATIVE
DEVICE MODEL ICD: 777665
RV LEAD AMPLITUDE: 11.5 mv
TOT-0007: 1
TOT-0008: 0
TOT-0009: 1
TOT-0010: 12
VENTRICULAR PACING ICD: 0.01 pct
VF: 0

## 2012-12-08 MED ORDER — LISINOPRIL 10 MG PO TABS: 10.0000 mg | ORAL_TABLET | Freq: Every day | ORAL | Status: DC

## 2012-12-08 NOTE — Patient Instructions (Addendum)
Your physician wants you to follow-up in: 6 months.   You will receive a reminder letter in the mail two months in advance. If you don't receive a letter, please call our office to schedule the follow-up appointment.  Your physician has recommended you make the following change in your medication: Increase lisinopril to 10 mg by mouth daily.  Have fasting lab work (lipid and liver profiles) checked at your primary care office.

## 2012-12-08 NOTE — Progress Notes (Signed)
History of Present Illness: 57 yo WM with history of tobacco abuse, CAD, MI who was admitted 01/22/09 with an acute anterior STEMI with thrombosis of the mid LAD stent placed in 2005. I placed a drug eluting stent in the mid LAD. After his MI, his hospitalization was complicated by atrial fibrillation. He was in sinus rhythm at the time of discharge from the hospital. He was also found to have severe hypokinesis of the anterior wall on echo and a possible LV thrombus. He was started on coumadin with Lovenox bridge at discharge. He was initially on coumadin but we switched to Pradaxa and then have chosen to continue this to limit the risk of LV thrombus given the fact that his anterior wall is akinetic. Cardiac MRI in the spring of 2011 with EF of 35% and full thickness scar in the apex, distal septum and inferoapex.He has been seen by Dr. Ladona Ridgel and an ICD was placed in August 2011 given his risk profile for sudden cardiac death. He has been followed in our device clinic by Dr. Ladona Ridgel.   He is here today for follow up. No chest pain, SOB, palpitations, near syncope or syncope. He has been taking all meds. He feels great.   Primary Care Physician: Shary Decamp  Last Lipid Profile:Lipid Panel     Component Value Date/Time   CHOL 154 02/19/2011 0947   TRIG 193.0* 02/19/2011 0947   HDL 38.60* 02/19/2011 0947   CHOLHDL 4 02/19/2011 0947   VLDL 38.6 02/19/2011 0947   LDLCALC 77 02/19/2011 0947     Past Medical History  Diagnosis Date  . Atrial fibrillation   . Ischemic cardiomyopathy   . Coronary atherosclerosis of native coronary artery     diagnosed 12/10   . Acute myocardial infarction of other anterior wall, episode of care unspecified   . LV (left ventricular) mural thrombus following MI   . ICD (implantable cardiac defibrillator) in place     Past Surgical History  Procedure Laterality Date  . Back surgery    . Vasectomy    . Knee surgery    . Shoulder surgery    . Cardiac defibrillator  placement      St Jude    Current Outpatient Prescriptions  Medication Sig Dispense Refill  . atorvastatin (LIPITOR) 40 MG tablet TAKE ONE TABLET BY MOUTH EVERY DAY  30 tablet  12  . clopidogrel (PLAVIX) 75 MG tablet TAKE ONE TABLET BY MOUTH EVERY DAY  30 tablet  3  . lisinopril (PRINIVIL,ZESTRIL) 5 MG tablet TAKE ONE TABLET BY MOUTH EVERY DAY  30 tablet  6  . metoprolol (LOPRESSOR) 50 MG tablet TAKE ONE TABLET BY MOUTH 2 TIMES A DAY  60 tablet  3  . nitroGLYCERIN (NITROSTAT) 0.4 MG SL tablet Place 0.4 mg under the tongue every 5 (five) minutes as needed.        Marland Kitchen PRADAXA 150 MG CAPS TAKE ONE CAPSULE BY MOUTH 2 TIMES A DAY  60 capsule  3   No current facility-administered medications for this visit.    No Known Allergies  History   Social History  . Marital Status: Married    Spouse Name: N/A    Number of Children: 2  . Years of Education: N/A   Occupational History  . Not on file.   Social History Main Topics  . Smoking status: Former Smoker    Quit date: 01/03/2009  . Smokeless tobacco: Not on file  . Alcohol Use: No  .  Drug Use: No  . Sexual Activity: Not on file   Other Topics Concern  . Not on file   Social History Narrative  . No narrative on file    Family History  Problem Relation Age of Onset  . Coronary artery disease Mother     Cabd  . Diabetes Mother   . Heart attack Father     died 38    Review of Systems:  As stated in the HPI and otherwise negative.   BP 142/88  Pulse 69  Ht 5\' 11"  (1.803 m)  Wt 221 lb 6.4 oz (100.426 kg)  BMI 30.89 kg/m2  Physical Examination: General: Well developed, well nourished, NAD HEENT: OP clear, mucus membranes moist SKIN: warm, dry. No rashes. Neuro: No focal deficits Musculoskeletal: Muscle strength 5/5 all ext Psychiatric: Mood and affect normal Neck: No JVD, no carotid bruits, no thyromegaly, no lymphadenopathy. Lungs:Clear bilaterally, no wheezes, rhonci, crackles Cardiovascular: Regular rate and  rhythm. No murmurs, gallops or rubs. Abdomen:Soft. Bowel sounds present. Non-tender.  Extremities: No lower extremity edema. Pulses are 2 + in the bilateral DP/PT.  EKG: NSR, rate 68 bpm. LAFB. Old lateral infarct.   Assessment and Plan:   1. CAD: Stable. No changes. Continue Plavix, statin, beta blocker, Ace-inhibitor. No ASA secondary to need for Pradaxa and Plavix.   2. Ischemic Cardiomyopathy: ICD in place. Volume status is ok. He is on good medical therapy. No changes. Last echo 2011. We discussed repeating but he wishes to wait until f/u in 6 months to discuss.   3. MURAL THROMBUS, APEX OF HEART : See above. On Pradaxa. He has chosen to continue for now with akinetic apex and risk of future thrombus.  4. HTN: BP elevated. Increase Lisinopril to 10 mg po Qdaily.   5. HYPERLIPIDEMIA: Continue statin. Repeat lipids and LFTs and he wishes to have done in primary care.

## 2012-12-08 NOTE — Progress Notes (Signed)
ICD check in clinic. Normal device function. Thresholds and sensing consistent with previous device measurements. Impedance trends stable over time. No evidence of any ventricular arrhythmias.  Histogram distribution appropriate for patient and level of activity. No changes made this session. Device programmed at appropriate safety margins. Device programmed to optimize intrinsic conduction. Estimated longevity 6.9 years.  Patient education completed including shock plan. Alert tones/vibration demonstrated for patient.  ROV in February with Dr. Ladona Ridgel.

## 2012-12-09 ENCOUNTER — Other Ambulatory Visit: Payer: Self-pay | Admitting: Cardiovascular Disease

## 2012-12-13 ENCOUNTER — Other Ambulatory Visit: Payer: Self-pay | Admitting: Cardiovascular Disease

## 2013-01-19 ENCOUNTER — Encounter: Payer: Self-pay | Admitting: Internal Medicine

## 2013-02-15 ENCOUNTER — Other Ambulatory Visit: Payer: Self-pay

## 2013-02-15 DIAGNOSIS — I10 Essential (primary) hypertension: Secondary | ICD-10-CM

## 2013-02-15 DIAGNOSIS — I2510 Atherosclerotic heart disease of native coronary artery without angina pectoris: Secondary | ICD-10-CM

## 2013-02-15 MED ORDER — ATORVASTATIN CALCIUM 40 MG PO TABS: ORAL_TABLET | ORAL | Status: DC

## 2013-02-15 MED ORDER — LISINOPRIL 10 MG PO TABS: 10.0000 mg | ORAL_TABLET | Freq: Every day | ORAL | Status: DC

## 2013-02-15 MED ORDER — METOPROLOL TARTRATE 50 MG PO TABS: ORAL_TABLET | ORAL | Status: DC

## 2013-02-15 MED ORDER — CLOPIDOGREL BISULFATE 75 MG PO TABS: ORAL_TABLET | ORAL | Status: DC

## 2013-03-11 ENCOUNTER — Ambulatory Visit (INDEPENDENT_AMBULATORY_CARE_PROVIDER_SITE_OTHER): Payer: Medicare HMO | Admitting: *Deleted

## 2013-03-11 DIAGNOSIS — I4891 Unspecified atrial fibrillation: Secondary | ICD-10-CM

## 2013-03-11 LAB — MDC_IDC_ENUM_SESS_TYPE_REMOTE
Battery Remaining Longevity: 82 mo
Battery Voltage: 2.98 V
Brady Statistic RV Percent Paced: 1 %
Date Time Interrogation Session: 20150206070656
HIGH POWER IMPEDANCE MEASURED VALUE: 53 Ohm
Lead Channel Impedance Value: 580 Ohm
Lead Channel Pacing Threshold Amplitude: 1 V
Lead Channel Pacing Threshold Pulse Width: 0.5 ms
Lead Channel Setting Pacing Amplitude: 2.5 V
Lead Channel Setting Sensing Sensitivity: 0.5 mV
MDC IDC MSMT BATTERY REMAINING PERCENTAGE: 70 %
MDC IDC MSMT LEADCHNL RV SENSING INTR AMPL: 11.5 mV
MDC IDC PG SERIAL: 777665
MDC IDC SET LEADCHNL RV PACING PULSEWIDTH: 0.5 ms
Zone Setting Detection Interval: 320 ms

## 2013-03-29 ENCOUNTER — Encounter: Payer: Self-pay | Admitting: *Deleted

## 2013-04-06 ENCOUNTER — Encounter: Payer: Self-pay | Admitting: Internal Medicine

## 2013-06-06 ENCOUNTER — Ambulatory Visit: Payer: Medicare Other | Admitting: Cardiovascular Disease

## 2013-06-14 ENCOUNTER — Ambulatory Visit (INDEPENDENT_AMBULATORY_CARE_PROVIDER_SITE_OTHER): Payer: Medicare HMO | Admitting: Cardiovascular Disease

## 2013-06-14 ENCOUNTER — Encounter: Payer: Self-pay | Admitting: Cardiovascular Disease

## 2013-06-14 VITALS — BP 120/82 | HR 66 | Ht 71.0 in | Wt 224.0 lb

## 2013-06-14 DIAGNOSIS — I513 Intracardiac thrombosis, not elsewhere classified: Secondary | ICD-10-CM

## 2013-06-14 DIAGNOSIS — I255 Ischemic cardiomyopathy: Secondary | ICD-10-CM

## 2013-06-14 DIAGNOSIS — I10 Essential (primary) hypertension: Secondary | ICD-10-CM

## 2013-06-14 DIAGNOSIS — E785 Hyperlipidemia, unspecified: Secondary | ICD-10-CM

## 2013-06-14 DIAGNOSIS — I2589 Other forms of chronic ischemic heart disease: Secondary | ICD-10-CM

## 2013-06-14 DIAGNOSIS — I2510 Atherosclerotic heart disease of native coronary artery without angina pectoris: Secondary | ICD-10-CM

## 2013-06-14 DIAGNOSIS — I219 Acute myocardial infarction, unspecified: Secondary | ICD-10-CM

## 2013-06-14 NOTE — Progress Notes (Signed)
History of Present Illness: 58 yo WM with history of tobacco abuse, CAD, ischemic cardiomyopathy, atrial fibrillation who is here today for cardiac follow up. He was admitted 01/22/09 with an acute anterior STEMI with thrombosis of the mid LAD stent placed in 2005. I placed a drug eluting stent in the mid LAD. After his MI, his hospitalization was complicated by atrial fibrillation. He was also found to have severe hypokinesis of the anterior wall on echo and a possible LV thrombus. He was started on coumadin with Lovenox bridge at discharge. He was initially on coumadin but we switched to Pradaxa and then have chosen to continue this to limit the risk of LV thrombus given the fact that his anterior wall is akinetic. Cardiac MRI in the spring of 2011 with EF of 35% and full thickness scar in the apex, distal septum and inferoapex.He has been seen by Dr. Ladona Ridgel and an ICD was placed in August 2011 given his risk profile for sudden cardiac death. He has been followed in our device clinic by Dr. Ladona Ridgel.   He is here today for follow up. No chest pain, SOB, palpitations, near syncope or syncope. He has been taking all meds. He feels great.   Primary Care Physician: Shary Decamp  Last Lipid Profile: Followed in primary care  Past Medical History  Diagnosis Date  . Atrial fibrillation   . Ischemic cardiomyopathy   . Coronary atherosclerosis of native coronary artery     diagnosed 12/10   . Acute myocardial infarction of other anterior wall, episode of care unspecified   . LV (left ventricular) mural thrombus following MI   . ICD (implantable cardiac defibrillator) in place     Past Surgical History  Procedure Laterality Date  . Back surgery    . Vasectomy    . Knee surgery    . Shoulder surgery    . Cardiac defibrillator placement      St Jude    Current Outpatient Prescriptions  Medication Sig Dispense Refill  . atorvastatin (LIPITOR) 40 MG tablet TAKE ONE TABLET BY MOUTH EVERY DAY  90  tablet  3  . clopidogrel (PLAVIX) 75 MG tablet TAKE ONE TABLET BY MOUTH EVERY DAY  90 tablet  3  . lisinopril (PRINIVIL,ZESTRIL) 10 MG tablet Take 1 tablet (10 mg total) by mouth daily.  90 tablet  1  . metoprolol (LOPRESSOR) 50 MG tablet TAKE ONE TABLET BY MOUTH 2 TIMES A DAY  180 tablet  4  . nitroGLYCERIN (NITROSTAT) 0.4 MG SL tablet Place 0.4 mg under the tongue every 5 (five) minutes as needed.        Marland Kitchen PRADAXA 150 MG CAPS capsule TAKE ONE CAPSULE BY MOUTH 2 TIMES A DAY  60 capsule  6  . Ranitidine HCl (RANITIDINE 75 PO)        No current facility-administered medications for this visit.    No Known Allergies  History   Social History  . Marital Status: Married    Spouse Name: N/A    Number of Children: 2  . Years of Education: N/A   Occupational History  . Not on file.   Social History Main Topics  . Smoking status: Former Smoker    Quit date: 01/03/2009  . Smokeless tobacco: Not on file  . Alcohol Use: No  . Drug Use: No  . Sexual Activity: Not on file   Other Topics Concern  . Not on file   Social History Narrative  .  No narrative on file    Family History  Problem Relation Age of Onset  . Coronary artery disease Mother     Cabd  . Diabetes Mother   . Heart attack Father     died [id number]    Review of Systems:  As stated in the HPI and otherwise negative.   BP 120/82  Pulse 66  Ht 5\' 11"  (1.803 m)  Wt 224 lb (101.606 kg)  BMI 31.26 kg/m2  Physical Examination: General: Well developed, well nourished, NAD HEENT: OP clear, mucus membranes moist SKIN: warm, dry. No rashes. Neuro: No focal deficits Musculoskeletal: Muscle strength 5/5 all ext Psychiatric: Mood and affect normal Neck: No JVD, no carotid bruits, no thyromegaly, no lymphadenopathy. Lungs:Clear bilaterally, no wheezes, rhonci, crackles Cardiovascular: Regular rate and rhythm. No murmurs, gallops or rubs. Abdomen:Soft. Bowel sounds present. Non-tender.  Extremities: No lower extremity  edema. Pulses are 2 + in the bilateral DP/PT.  Assessment and Plan:   1. CAD: Stable. No changes. Continue Plavix, statin, beta blocker, Ace-inhibitor. No ASA secondary to need for Pradaxa and Plavix. Will continue Plavix given his coronary issues, stent and prior thrombosis.   2. Ischemic Cardiomyopathy: ICD in place. Volume status is ok. He is on good medical therapy. No changes. Last echo 2011. Will repeat now  3. MURAL THROMBUS, APEX OF HEART : See above. On Pradaxa. He has chosen to continue for now with akinetic apex and risk of future thrombus. He understands increased risk of bleeding on Pradaxa and Plavix but I think this is indicated given his apical akinesis.   4. HTN: BP controlled. NO changes.   5. HYPERLIPIDEMIA: Continue statin. Lipids followed in primary care

## 2013-06-14 NOTE — Patient Instructions (Signed)
Your physician wants you to follow-up in:  12 months. You will receive a reminder letter in the mail two months in advance. If you don't receive a letter, please call our office to schedule the follow-up appointment.  Your physician has requested that you have an echocardiogram. Echocardiography is a painless test that uses sound waves to create images of your heart. It provides your doctor with information about the size and shape of your heart and how well your heart's chambers and valves are working. This procedure takes approximately one hour. There are no restrictions for this procedure. To be done in 1-2 months.

## 2013-06-29 ENCOUNTER — Other Ambulatory Visit: Payer: Self-pay | Admitting: Cardiovascular Disease

## 2013-07-11 ENCOUNTER — Other Ambulatory Visit: Payer: Self-pay | Admitting: Cardiovascular Disease

## 2013-08-04 ENCOUNTER — Other Ambulatory Visit: Payer: Self-pay

## 2013-08-04 MED ORDER — NITROGLYCERIN 0.4 MG SL SUBL: 0.4000 mg | SUBLINGUAL_TABLET | SUBLINGUAL | Status: DC | PRN

## 2013-08-10 ENCOUNTER — Encounter: Payer: Medicare HMO | Admitting: Internal Medicine

## 2013-08-10 ENCOUNTER — Other Ambulatory Visit (HOSPITAL_COMMUNITY): Payer: Medicare HMO

## 2013-09-13 ENCOUNTER — Encounter: Payer: Self-pay | Admitting: *Deleted

## 2013-10-11 ENCOUNTER — Encounter: Payer: Self-pay | Admitting: Internal Medicine

## 2013-10-11 ENCOUNTER — Ambulatory Visit (INDEPENDENT_AMBULATORY_CARE_PROVIDER_SITE_OTHER): Payer: Medicare HMO | Admitting: Internal Medicine

## 2013-10-11 VITALS — BP 98/60 | HR 65 | Ht 71.0 in | Wt 221.8 lb

## 2013-10-11 DIAGNOSIS — I4891 Unspecified atrial fibrillation: Secondary | ICD-10-CM

## 2013-10-11 DIAGNOSIS — I480 Paroxysmal atrial fibrillation: Secondary | ICD-10-CM

## 2013-10-11 DIAGNOSIS — Z95810 Presence of automatic (implantable) cardiac defibrillator: Secondary | ICD-10-CM

## 2013-10-11 DIAGNOSIS — I2589 Other forms of chronic ischemic heart disease: Secondary | ICD-10-CM

## 2013-10-11 LAB — MDC_IDC_ENUM_SESS_TYPE_INCLINIC
Battery Remaining Longevity: 75.6 mo
Brady Statistic RV Percent Paced: 0 %
Date Time Interrogation Session: 20150908123047
HighPow Impedance: 57.9443
Implantable Pulse Generator Serial Number: 777665
Lead Channel Impedance Value: 637.5 Ohm
Lead Channel Pacing Threshold Amplitude: 0.75 V
Lead Channel Pacing Threshold Pulse Width: 0.5 ms
Lead Channel Setting Pacing Amplitude: 2.5 V
Lead Channel Setting Pacing Pulse Width: 0.5 ms
Lead Channel Setting Sensing Sensitivity: 0.5 mV
MDC IDC MSMT LEADCHNL RV SENSING INTR AMPL: 11.5 mV
MDC IDC SET ZONE DETECTION INTERVAL: 320 ms

## 2013-10-11 NOTE — Assessment & Plan Note (Signed)
He denies anginal symptoms. No change in meds. He remains very active.

## 2013-10-11 NOTE — Patient Instructions (Signed)

## 2013-10-11 NOTE — Progress Notes (Signed)
HPI [name] returns today for followup. He is a pleasant 58 year old man with an ischemic cardiomyopathy, status post anterior myocardial infarction in 2005, with stent restenosis in 2010, status post insertion of a drug-eluting stent at that time. He has some point developed severe left ventricular dysfunction, and paroxysmal atrial fibrillation. He is status post ICD implantation. He is maintained on anticoagulation with both Plavix and Pradaxa. He has had no bleeding. He denies easy bruisability. No chest pain. No peripheral edema. He remains active. No Known Allergies   Current Outpatient Prescriptions  Medication Sig Dispense Refill  . atorvastatin (LIPITOR) 40 MG tablet TAKE ONE TABLET BY MOUTH EVERY DAY  90 tablet  3  . clopidogrel (PLAVIX) 75 MG tablet TAKE ONE TABLET BY MOUTH EVERY DAY  90 tablet  3  . lisinopril (PRINIVIL,ZESTRIL) 10 MG tablet TAKE 1 TABLET EVERY DAY  90 tablet  1  . metoprolol (LOPRESSOR) 50 MG tablet TAKE ONE TABLET BY MOUTH 2 TIMES A DAY  180 tablet  4  . nitroGLYCERIN (NITROSTAT) 0.4 MG SL tablet Place 0.4 mg under the tongue every 5 (five) minutes as needed for chest pain.      Marland Kitchen PRADAXA 150 MG CAPS capsule TAKE ONE CAPSULE BY MOUTH 2 TIMES A DAY  60 capsule  3  . Ranitidine HCl (RANITIDINE 75 PO) Take 1 capsule by mouth at bedtime.        No current facility-administered medications for this visit.     Past Medical History  Diagnosis Date  . Atrial fibrillation   . Ischemic cardiomyopathy   . Coronary atherosclerosis of native coronary artery     diagnosed 12/10   . Acute myocardial infarction of other anterior wall, episode of care unspecified   . LV (left ventricular) mural thrombus following MI   . ICD (implantable cardiac defibrillator) in place     ROS:   All systems reviewed and negative except as noted in the HPI.   Past Surgical History  Procedure Laterality Date  . Back surgery    . Vasectomy    . Knee surgery    . Shoulder surgery     . Cardiac defibrillator placement      St Jude     Family History  Problem Relation Age of Onset  . Coronary artery disease Mother     Cabd  . Diabetes Mother   . Heart attack Father     died 70     History   Social History  . Marital Status: Married    Spouse Name: N/A    Number of Children: 2  . Years of Education: N/A   Occupational History  . Not on file.   Social History Main Topics  . Smoking status: Former Smoker    Quit date: 01/03/2009  . Smokeless tobacco: Not on file  . Alcohol Use: No  . Drug Use: No  . Sexual Activity: Not on file   Other Topics Concern  . Not on file   Social History Narrative  . No narrative on file     BP 98/60  Pulse 65  Ht 5\' 11"  (1.803 m)  Wt 221 lb 12.8 oz (100.608 kg)  BMI 30.95 kg/m2  Physical Exam:  Overweight but well appearing middle-aged man, NAD HEENT: Unremarkable Neck:  7 cm JVD, no thyromegally Lungs:  Clear with no wheezes, rales, or rhonchi. HEART:  Regular rate rhythm, no murmurs, no rubs, no clicks Abd:  soft, positive bowel sounds, no organomegally,  no rebound, no guarding Ext:  2 plus pulses, no edema, no cyanosis, no clubbing Skin:  No rashes no nodules Neuro:  CN II through XII intact, motor grossly intact  DEVICE  Normal device function.  See PaceArt for details.   Assess/Plan:

## 2013-10-11 NOTE — Assessment & Plan Note (Signed)
His device is working normally. Will recheck in several months. 

## 2013-11-08 ENCOUNTER — Encounter: Payer: Self-pay | Admitting: *Deleted

## 2013-11-10 ENCOUNTER — Other Ambulatory Visit: Payer: Self-pay | Admitting: Cardiovascular Disease

## 2013-12-08 ENCOUNTER — Telehealth: Payer: Self-pay | Admitting: Cardiovascular Disease

## 2013-12-08 ENCOUNTER — Telehealth: Payer: Self-pay

## 2013-12-08 NOTE — Telephone Encounter (Signed)
Patient called to get samples on pradaxa 150 mg we did not have any 150 mg but we did have 75 mg I asked the Pharmacy if he could take pradaxa 75 mg and take two tablets twice a day patient understood

## 2014-01-02 ENCOUNTER — Other Ambulatory Visit: Payer: Self-pay | Admitting: Cardiovascular Disease

## 2014-01-05 ENCOUNTER — Telehealth: Payer: Self-pay | Admitting: *Deleted

## 2014-01-05 NOTE — Telephone Encounter (Signed)
Error

## 2014-01-05 NOTE — Telephone Encounter (Signed)
Pradaxa samples placed at the front desk for patient. 

## 2014-01-12 ENCOUNTER — Encounter: Payer: Self-pay | Admitting: Internal Medicine

## 2014-01-12 ENCOUNTER — Ambulatory Visit (INDEPENDENT_AMBULATORY_CARE_PROVIDER_SITE_OTHER): Payer: Medicare HMO | Admitting: *Deleted

## 2014-01-12 DIAGNOSIS — I255 Ischemic cardiomyopathy: Secondary | ICD-10-CM

## 2014-01-12 LAB — MDC_IDC_ENUM_SESS_TYPE_REMOTE
Battery Remaining Longevity: 73 mo
Battery Remaining Percentage: 64 %
Battery Voltage: 2.96 V
Date Time Interrogation Session: 20151210104411
HighPow Impedance: 56 Ohm
Implantable Pulse Generator Serial Number: 777665
Lead Channel Sensing Intrinsic Amplitude: 11.5 mV
Lead Channel Setting Pacing Amplitude: 2.5 V
Lead Channel Setting Pacing Pulse Width: 0.5 ms
Lead Channel Setting Sensing Sensitivity: 0.5 mV
MDC IDC MSMT LEADCHNL RV IMPEDANCE VALUE: 560 Ohm
MDC IDC SET ZONE DETECTION INTERVAL: 320 ms
MDC IDC STAT BRADY RV PERCENT PACED: 1 %

## 2014-01-12 NOTE — Progress Notes (Signed)
Remote ICD transmission.   

## 2014-01-30 ENCOUNTER — Telehealth: Payer: Self-pay | Admitting: *Deleted

## 2014-01-30 NOTE — Telephone Encounter (Signed)
Samples of Pradaxa 150mg  left for patient at front desk.

## 2014-02-06 ENCOUNTER — Encounter: Payer: Self-pay | Admitting: Cardiology

## 2014-03-30 ENCOUNTER — Ambulatory Visit (HOSPITAL_COMMUNITY)
Admission: RE | Admit: 2014-03-30 | Discharge: 2014-03-30 | Disposition: A | Discharge status: Home / Self Care | Payer: Medicare HMO | Source: Ambulatory Visit | Attending: Internal Medicine | Admitting: Internal Medicine

## 2014-03-30 ENCOUNTER — Ambulatory Visit (INDEPENDENT_AMBULATORY_CARE_PROVIDER_SITE_OTHER): Payer: Medicare HMO | Admitting: *Deleted

## 2014-03-30 ENCOUNTER — Telehealth: Payer: Self-pay | Admitting: Internal Medicine

## 2014-03-30 DIAGNOSIS — T82118A Breakdown (mechanical) of other cardiac electronic device, initial encounter: Secondary | ICD-10-CM | POA: Insufficient documentation

## 2014-03-30 DIAGNOSIS — T82110A Breakdown (mechanical) of cardiac electrode, initial encounter: Secondary | ICD-10-CM

## 2014-03-30 DIAGNOSIS — Y838 Other surgical procedures as the cause of abnormal reaction of the patient, or of later complication, without mention of misadventure at the time of the procedure: Secondary | ICD-10-CM | POA: Diagnosis not present

## 2014-03-30 NOTE — Telephone Encounter (Signed)
I asked patient to send in a manual transmission. Patient states that he is not at home at the moment, but will send once he gets home. I encouraged him to go to ER if his device happens to shock him before he gets home since we are unsure of why it is vibrating. I explained to him that the vibration indicates that there may be a change, but without the report I wouldn't be able to tell him what that change is. Patient voiced understanding and states that he might return home sooner than what he was planning. Will forward message to [facility] to look out for remote.

## 2014-03-30 NOTE — Telephone Encounter (Signed)
New message      Pt states that while driving his defibulator vribrated twice.  It did not shock him.  Please call

## 2014-03-30 NOTE — Telephone Encounter (Signed)
Spoke w/pt and advised to please come to office for device check. Pt was advised not to drive and have family member drive to office. Pt understood and wife to drive to appointment.

## 2014-03-30 NOTE — Telephone Encounter (Signed)
LMOVM for pt to return call 

## 2014-03-30 NOTE — Telephone Encounter (Signed)
F/U        Pt is returning call. Not sure if his transmission is going through?   Please call back.

## 2014-04-03 LAB — MDC_IDC_ENUM_SESS_TYPE_INCLINIC
HighPow Impedance: 58 Ohm
Implantable Pulse Generator Serial Number: 777665
Lead Channel Impedance Value: 2050 Ohm
Lead Channel Pacing Threshold Amplitude: 0.75 V
Lead Channel Pacing Threshold Pulse Width: 0.4 ms
Lead Channel Sensing Intrinsic Amplitude: 12 mV
Lead Channel Setting Pacing Amplitude: 2.5 V
Lead Channel Setting Pacing Pulse Width: 0.5 ms
Lead Channel Setting Sensing Sensitivity: 0.5 mV
Zone Setting Detection Interval: 320 ms

## 2014-04-03 NOTE — Progress Notes (Signed)
ICD check in clinic due to alert for high RV lead impedance. RV lead impedance at 2050 ohms.  Lead impedance checked several times with readings at 700, 690, 710, 680, and 750. Rwaves and threshold were normal. Pt was scheduled for chest XR and magnet was given to pt in case of inappropriate shocks.  JA reviewed. Pt sent for chest XR at hospital. Follow up as planned or sooner with results of chest XR.

## 2014-04-04 ENCOUNTER — Ambulatory Visit (INDEPENDENT_AMBULATORY_CARE_PROVIDER_SITE_OTHER): Payer: Medicare HMO | Admitting: Internal Medicine

## 2014-04-04 ENCOUNTER — Encounter: Payer: Self-pay | Admitting: Internal Medicine

## 2014-04-04 ENCOUNTER — Other Ambulatory Visit: Payer: Self-pay

## 2014-04-04 VITALS — BP 126/72 | HR 69 | Ht 71.0 in | Wt 222.4 lb

## 2014-04-04 DIAGNOSIS — Z720 Tobacco use: Secondary | ICD-10-CM

## 2014-04-04 DIAGNOSIS — F17200 Nicotine dependence, unspecified, uncomplicated: Secondary | ICD-10-CM

## 2014-04-04 DIAGNOSIS — I480 Paroxysmal atrial fibrillation: Secondary | ICD-10-CM

## 2014-04-04 DIAGNOSIS — Z95810 Presence of automatic (implantable) cardiac defibrillator: Secondary | ICD-10-CM

## 2014-04-04 LAB — MDC_IDC_ENUM_SESS_TYPE_INCLINIC
Brady Statistic RV Percent Paced: 0 %
Date Time Interrogation Session: 20160301125816
HighPow Impedance: 58.2049
Implantable Pulse Generator Serial Number: 777665
Lead Channel Impedance Value: 587.5 Ohm
Lead Channel Pacing Threshold Amplitude: 0.75 V
Lead Channel Pacing Threshold Amplitude: 0.75 V
Lead Channel Pacing Threshold Pulse Width: 0.5 ms
Lead Channel Pacing Threshold Pulse Width: 0.5 ms
Lead Channel Sensing Intrinsic Amplitude: 11.5 mV
Lead Channel Setting Pacing Amplitude: 2.5 V
Lead Channel Setting Pacing Pulse Width: 0.5 ms
Lead Channel Setting Sensing Sensitivity: 0.5 mV
MDC IDC MSMT BATTERY REMAINING LONGEVITY: 70.8 mo
Zone Setting Detection Interval: 320 ms

## 2014-04-04 NOTE — Assessment & Plan Note (Signed)
He remains active and has no anginal symptoms. He will continue his current medical therapy.

## 2014-04-04 NOTE — Progress Notes (Signed)
HPI [name] returns today for followup. He is a pleasant 59 year old man with an ischemic cardiomyopathy, status post anterior myocardial infarction in 2005, with stent restenosis in 2010, status post insertion of a drug-eluting stent at that time. He has some point developed severe left ventricular dysfunction, and paroxysmal atrial fibrillation. He is status post ICD implantation. He is maintained on anticoagulation with both Plavix and Pradaxa. He has had no bleeding. He denies easy bruisability. No chest pain. No peripheral edema. He remains active. The patient received an alert on his ICD with one isolated RV pacing impedance elevation. All subsequent evaluations have been normal and a chest x-ray demonstrated no evidence of lead fracture. Overall he feels well. No Known Allergies   Current Outpatient Prescriptions  Medication Sig Dispense Refill  . atorvastatin (LIPITOR) 40 MG tablet TAKE 1 TABLET EVERY DAY (Patient taking differently: TAKE 1 TABLET BY MOUTH EVERY DAY) 90 tablet 2  . clopidogrel (PLAVIX) 75 MG tablet TAKE 1 TABLET EVERY DAY (Patient taking differently: TAKE 1 TABLET  BY MOUTH EVERY DAY) 90 tablet 2  . lisinopril (PRINIVIL,ZESTRIL) 10 MG tablet TAKE 1 TABLET EVERY DAY (Patient taking differently: TAKE 1 TABLET BY MOUTH  EVERY DAY) 90 tablet 2  . metoprolol (LOPRESSOR) 50 MG tablet TAKE 1 TABLET TWICE DAILY (Patient taking differently: TAKE 1 TABLET BY MOUTH  TWICE DAILY) 180 tablet 2  . nitroGLYCERIN (NITROSTAT) 0.4 MG SL tablet Place 0.4 mg under the tongue every 5 (five) minutes as needed for chest pain (MAX 3 TABLETS).     Marland Kitchen PRADAXA 150 MG CAPS capsule TAKE ONE CAPSULE BY MOUTH 2 TIMES A DAY 60 capsule 6  . Ranitidine HCl (RANITIDINE 75 PO) Take 1 capsule by mouth at bedtime.      No current facility-administered medications for this visit.     Past Medical History  Diagnosis Date  . Atrial fibrillation   . Ischemic cardiomyopathy   . Coronary atherosclerosis of  native coronary artery     diagnosed 12/10   . Acute myocardial infarction of other anterior wall, episode of care unspecified   . LV (left ventricular) mural thrombus following MI   . ICD (implantable cardiac defibrillator) in place     ROS:   All systems reviewed and negative except as noted in the HPI.   Past Surgical History  Procedure Laterality Date  . Back surgery    . Vasectomy    . Knee surgery    . Shoulder surgery    . Cardiac defibrillator placement      St Jude     Family History  Problem Relation Age of Onset  . Coronary artery disease Mother     Cabd  . Diabetes Mother   . Heart attack Father     died 31     History   Social History  . Marital Status: Married    Spouse Name: N/A  . Number of Children: 2  . Years of Education: N/A   Occupational History  . Not on file.   Social History Main Topics  . Smoking status: Former Smoker    Quit date: 01/03/2009  . Smokeless tobacco: Not on file  . Alcohol Use: No  . Drug Use: No  . Sexual Activity: Not on file   Other Topics Concern  . Not on file   Social History Narrative     BP 126/72 mmHg  Pulse 69  Ht  (1.803 m)  Wt 222 lb 6.4 oz (  100.88 kg)  BMI 31.03 kg/m2  Physical Exam: well appearing middle-aged man, NAD HEENT: Unremarkable Neck:  7 cm JVD, no thyromegally Lungs:  Clear with no wheezes, rales, or rhonchi. HEART:  Regular rate rhythm, no murmurs, no rubs, no clicks Abd:  soft, positive bowel sounds, no organomegally, no rebound, no guarding Ext:  2 plus pulses, no edema, no cyanosis, no clubbing Skin:  No rashes no nodules Neuro:  CN II through XII intact, motor grossly intact  DEVICE  Normal device function.  See PaceArt for details.   Assess/Plan:

## 2014-04-04 NOTE — Patient Instructions (Signed)
Your physician wants you to follow-up in: August with Dr Court Joy will receive a reminder letter in the mail two months in advance. If you don't receive a letter, please call our office to schedule the follow-up appointment.   Remote monitoring is used to monitor your Pacemaker of ICD from home. This monitoring reduces the number of office visits required to check your device to one time per year. It allows [place] to keep an eye on the functioning of your device to ensure it is working properly. You are scheduled for a device check from home on 5/31/. You may send your transmission at any time that day. If you have a wireless device, the transmission will be sent automatically. After your physician reviews your transmission, you will receive a postcard with your next transmission date.

## 2014-04-04 NOTE — Assessment & Plan Note (Signed)
The etiology of his elevated isolated pacing impedance is unclear. His device appears to be working normally now. We'll see him back in several months.

## 2014-04-04 NOTE — Assessment & Plan Note (Signed)
He is encouraged to stop smoking. 

## 2014-04-04 NOTE — Assessment & Plan Note (Signed)
He will continue systemic anticoagulation. He is currently asymptomatic.

## 2014-04-13 ENCOUNTER — Telehealth: Payer: Self-pay

## 2014-04-13 NOTE — Telephone Encounter (Signed)
Patient called for samples of pradaxa placed up front

## 2014-04-17 ENCOUNTER — Encounter: Payer: Self-pay | Admitting: Internal Medicine

## 2014-04-24 ENCOUNTER — Telehealth: Payer: Self-pay | Admitting: Physician Assistant

## 2014-04-24 NOTE — Telephone Encounter (Signed)
    Patient had some issues with vibrating in his ICD. It happened twice for about 10 seconds. Now resolved. This has happened before and device interrogated and apparently normal. I will let Dr. Ladona Ridgel know and patient will call office tomorrow AM. Otherwise he is feeling completely fine, but just wanted to make [place] aware.    Cline Crock PA-C  [facility]

## 2014-04-25 ENCOUNTER — Telehealth: Payer: Self-pay | Admitting: Internal Medicine

## 2014-04-25 ENCOUNTER — Ambulatory Visit (INDEPENDENT_AMBULATORY_CARE_PROVIDER_SITE_OTHER): Payer: Medicare HMO | Admitting: *Deleted

## 2014-04-25 ENCOUNTER — Encounter: Payer: Self-pay | Admitting: Internal Medicine

## 2014-04-25 DIAGNOSIS — I480 Paroxysmal atrial fibrillation: Secondary | ICD-10-CM

## 2014-04-25 DIAGNOSIS — I255 Ischemic cardiomyopathy: Secondary | ICD-10-CM

## 2014-04-25 NOTE — Telephone Encounter (Signed)
New message       1. Has your device fired? no 2. Is you device beeping?  Not beeping but vibrating.  It did it last night about 10pm 3. Are you experiencing draining or swelling at device site?no 4. Are you calling to see if we received your device transmission? no  5. Have you passed out? no

## 2014-04-25 NOTE — Telephone Encounter (Signed)
Spoke w/ pt and informed him that we received an alert about his lead impedence. Pt agreed to come into office today at 11.

## 2014-04-26 LAB — MDC_IDC_ENUM_SESS_TYPE_INCLINIC
Brady Statistic RV Percent Paced: 0 %
HighPow Impedance: 51.7777
Lead Channel Impedance Value: 612.5 Ohm
Lead Channel Pacing Threshold Amplitude: 0.75 V
Lead Channel Pacing Threshold Pulse Width: 0.5 ms
Lead Channel Pacing Threshold Pulse Width: 0.5 ms
Lead Channel Sensing Intrinsic Amplitude: 11.5 mV
Lead Channel Setting Pacing Amplitude: 2.5 V
Lead Channel Setting Pacing Pulse Width: 0.5 ms
MDC IDC MSMT BATTERY REMAINING LONGEVITY: 70.8 mo
MDC IDC MSMT LEADCHNL RV PACING THRESHOLD AMPLITUDE: 0.75 V
MDC IDC PG SERIAL: 777665
MDC IDC SESS DTM: 20160322140300
MDC IDC SET LEADCHNL RV SENSING SENSITIVITY: 0.5 mV
Zone Setting Detection Interval: 320 ms

## 2014-04-26 NOTE — Progress Notes (Signed)
ICD check in clinic for RV impedance alert (N/C). Normal device function. Threshold and sensing consistent with previous device measurements. Impedance measurement from last night was 2050ohms. Today the impedance has returned to NL---640ohms. No evidence of any ventricular arrhythmias. Histogram distribution appropriate for patient and level of activity. Vibratory alert for RV lead impedance D/C'd per GT--Merlin alert still active. Device programmed at appropriate safety margins. Device programmed to optimize intrinsic conduction. Estimated longevity 5.9 years. Pt enrolled in remote follow-up. Plan to follow up as scheduled.

## 2014-06-01 ENCOUNTER — Encounter: Payer: Self-pay | Admitting: Internal Medicine

## 2014-06-16 ENCOUNTER — Ambulatory Visit: Payer: Medicare HMO | Admitting: Cardiovascular Disease

## 2014-07-04 ENCOUNTER — Ambulatory Visit (INDEPENDENT_AMBULATORY_CARE_PROVIDER_SITE_OTHER): Payer: Medicare HMO | Admitting: *Deleted

## 2014-07-04 DIAGNOSIS — I255 Ischemic cardiomyopathy: Secondary | ICD-10-CM | POA: Diagnosis not present

## 2014-07-05 NOTE — Progress Notes (Signed)
Remote ICD transmission.   

## 2014-07-09 LAB — CUP PACEART REMOTE DEVICE CHECK
Battery Remaining Percentage: 59 %
Battery Voltage: 2.96 V
HIGH POWER IMPEDANCE MEASURED VALUE: 55 Ohm
Lead Channel Impedance Value: 530 Ohm
Lead Channel Pacing Threshold Amplitude: 0.75 V
Lead Channel Pacing Threshold Pulse Width: 0.5 ms
Lead Channel Setting Pacing Amplitude: 2.5 V
MDC IDC MSMT BATTERY REMAINING LONGEVITY: 68 mo
MDC IDC MSMT LEADCHNL RV SENSING INTR AMPL: 11.5 mV
MDC IDC PG SERIAL: 777665
MDC IDC SESS DTM: 20160531073943
MDC IDC SET LEADCHNL RV PACING PULSEWIDTH: 0.5 ms
MDC IDC SET LEADCHNL RV SENSING SENSITIVITY: 0.5 mV
MDC IDC SET ZONE DETECTION INTERVAL: 320 ms
MDC IDC STAT BRADY RV PERCENT PACED: 1 %

## 2014-07-14 ENCOUNTER — Encounter: Payer: Self-pay | Admitting: Cardiology

## 2014-07-18 ENCOUNTER — Encounter: Payer: Self-pay | Admitting: Internal Medicine

## 2014-08-01 ENCOUNTER — Telehealth: Payer: Self-pay

## 2014-08-01 ENCOUNTER — Telehealth: Payer: Self-pay | Admitting: Cardiovascular Disease

## 2014-08-01 DIAGNOSIS — I2510 Atherosclerotic heart disease of native coronary artery without angina pectoris: Secondary | ICD-10-CM

## 2014-08-01 NOTE — Telephone Encounter (Signed)
Patient would like to change meds  for  cost savings He wonders if changinf drom Pradaxa to Xarelto would work for him

## 2014-08-01 NOTE — Telephone Encounter (Signed)
Pat, I am ok with changing him to Xarelto but it may not be much cheaper. He could also consider coumadin which will be much cheaper but harder to manage. Thayer Ohm

## 2014-08-01 NOTE — Telephone Encounter (Signed)
Patient called for samples of pradaxa 150 mg  

## 2014-08-01 NOTE — Telephone Encounter (Signed)
NEw message     Pt is currently on pradaxa, would like to try xarelto. Please advise

## 2014-08-02 NOTE — Telephone Encounter (Signed)
Spoke with pt. He will come in for BMP and CBC tomorrow. I told him we would call him with Xarelto instructions once lab results are available.

## 2014-08-02 NOTE — Telephone Encounter (Signed)
Reviewed with Weston Brass, PharmD and pt will need CBC and BMP before making change.  I placed call to pt and left message to call back

## 2014-08-02 NOTE — Addendum Note (Signed)
Addended by: Dossie Arbour on: 08/02/2014 03:40 PM   Modules accepted: Orders

## 2014-08-03 ENCOUNTER — Other Ambulatory Visit (INDEPENDENT_AMBULATORY_CARE_PROVIDER_SITE_OTHER): Payer: Medicare HMO | Admitting: *Deleted

## 2014-08-03 DIAGNOSIS — I2510 Atherosclerotic heart disease of native coronary artery without angina pectoris: Secondary | ICD-10-CM | POA: Diagnosis not present

## 2014-08-03 LAB — CBC WITH DIFFERENTIAL/PLATELET
BASOS ABS: 0.1 10*3/uL (ref 0.0–0.1)
Basophils Relative: 0.8 % (ref 0.0–3.0)
EOS ABS: 0.2 10*3/uL (ref 0.0–0.7)
Eosinophils Relative: 2.3 % (ref 0.0–5.0)
HCT: 46.1 % (ref 39.0–52.0)
HEMOGLOBIN: 15.6 g/dL (ref 13.0–17.0)
LYMPHS ABS: 1.9 10*3/uL (ref 0.7–4.0)
Lymphocytes Relative: 27.7 % (ref 12.0–46.0)
MCHC: 34 g/dL (ref 30.0–36.0)
MCV: 86.9 fl (ref 78.0–100.0)
MONO ABS: 0.5 10*3/uL (ref 0.1–1.0)
MONOS PCT: 6.6 % (ref 3.0–12.0)
NEUTROS ABS: 4.3 10*3/uL (ref 1.4–7.7)
NEUTROS PCT: 62.6 % (ref 43.0–77.0)
Platelets: 252 10*3/uL (ref 150.0–400.0)
RBC: 5.3 Mil/uL (ref 4.22–5.81)
RDW: 13.5 % (ref 11.5–15.5)
WBC: 6.9 10*3/uL (ref 4.0–10.5)

## 2014-08-03 LAB — BASIC METABOLIC PANEL
BUN: 15 mg/dL (ref 6–23)
CALCIUM: 10.1 mg/dL (ref 8.4–10.5)
CHLORIDE: 103 meq/L (ref 96–112)
CO2: 28 meq/L (ref 19–32)
Creatinine, Ser: 0.86 mg/dL (ref 0.40–1.50)
GFR: 96.57 mL/min (ref >60.00)
GLUCOSE: 118 mg/dL — AB (ref 70–99)
POTASSIUM: 4.2 meq/L (ref 3.5–5.1)
Sodium: 138 mEq/L (ref 135–145)

## 2014-08-08 ENCOUNTER — Encounter: Payer: Self-pay | Admitting: Internal Medicine

## 2014-08-10 ENCOUNTER — Telehealth: Payer: Self-pay | Admitting: Cardiovascular Disease

## 2014-08-10 MED ORDER — RIVAROXABAN 20 MG PO TABS: 20.0000 mg | ORAL_TABLET | Freq: Every day | ORAL | Status: DC

## 2014-08-10 NOTE — Telephone Encounter (Signed)
Labs reviewed with Dr. Clifton James and OK to stop Pradaxa and change to Xarelto 20 mg by mouth daily.

## 2014-08-10 NOTE — Telephone Encounter (Signed)
See previous phone call for documentation

## 2014-08-10 NOTE — Telephone Encounter (Signed)
I placed call to pt and left message to call back.

## 2014-08-10 NOTE — Addendum Note (Signed)
Addended by: Dossie Arbour on: 08/10/2014 04:52 PM   Modules accepted: Orders, Medications

## 2014-08-10 NOTE — Telephone Encounter (Signed)
Follow Up  Pt Just returning the call

## 2014-08-10 NOTE — Telephone Encounter (Signed)
Pt notified of lab results and instructions to stop Pradaxa and start Xarelto 20 mg by mouth daily with the evening meal.  Will send prescription to [facility] mail order

## 2014-09-05 ENCOUNTER — Other Ambulatory Visit: Payer: Self-pay | Admitting: Cardiovascular Disease

## 2014-10-03 ENCOUNTER — Encounter: Payer: Self-pay | Admitting: Internal Medicine

## 2014-10-03 ENCOUNTER — Ambulatory Visit (INDEPENDENT_AMBULATORY_CARE_PROVIDER_SITE_OTHER): Payer: Medicare HMO | Admitting: Internal Medicine

## 2014-10-03 VITALS — BP 126/84 | HR 65 | Ht 71.0 in | Wt 219.2 lb

## 2014-10-03 DIAGNOSIS — Z95810 Presence of automatic (implantable) cardiac defibrillator: Secondary | ICD-10-CM

## 2014-10-03 DIAGNOSIS — I10 Essential (primary) hypertension: Secondary | ICD-10-CM | POA: Diagnosis not present

## 2014-10-03 DIAGNOSIS — I480 Paroxysmal atrial fibrillation: Secondary | ICD-10-CM

## 2014-10-03 LAB — CUP PACEART INCLINIC DEVICE CHECK
Battery Remaining Longevity: 66 mo
Brady Statistic RV Percent Paced: 0 %
Date Time Interrogation Session: 20160830111809
Lead Channel Impedance Value: 612.5 Ohm
Lead Channel Pacing Threshold Pulse Width: 0.5 ms
Lead Channel Sensing Intrinsic Amplitude: 11.5 mV
Lead Channel Setting Pacing Amplitude: 2.5 V
MDC IDC MSMT LEADCHNL RV PACING THRESHOLD AMPLITUDE: 0.75 V
MDC IDC MSMT LEADCHNL RV PACING THRESHOLD AMPLITUDE: 0.75 V
MDC IDC MSMT LEADCHNL RV PACING THRESHOLD PULSEWIDTH: 0.5 ms
MDC IDC SET LEADCHNL RV PACING PULSEWIDTH: 0.5 ms
MDC IDC SET LEADCHNL RV SENSING SENSITIVITY: 0.5 mV
Pulse Gen Serial Number: 777665
Zone Setting Detection Interval: 320 ms

## 2014-10-03 NOTE — Assessment & Plan Note (Signed)
He remains active. No anginal symptoms. Will follow.

## 2014-10-03 NOTE — Assessment & Plan Note (Signed)
He is maintaining NSR. He will continue his Xarelto.

## 2014-10-03 NOTE — Progress Notes (Signed)
HPI [name] returns today for followup. He is a pleasant 59 year old man with an ischemic cardiomyopathy, status post anterior myocardial infarction in 2005, with stent restenosis in 2010, status post insertion of a drug-eluting stent at that time. He has some point developed severe left ventricular dysfunction, and paroxysmal atrial fibrillation. He is status post ICD implantation. He is maintained on anticoagulation with both Plavix and Pradaxa. He has had no bleeding. He denies easy bruisability. No chest pain. No peripheral edema. He remains active. The patient received an alert on his ICD and has had 3 isolated RV pacing impedance elevations. All subsequent evaluations have been normal and a chest x-ray demonstrated no evidence of lead fracture. No sensing or pacing issues. Overall he feels well. No Known Allergies   Current Outpatient Prescriptions  Medication Sig Dispense Refill  . acetaminophen (TYLENOL) 500 MG tablet Take 500 mg by mouth every 6 (six) hours as needed for moderate pain.    Marland Kitchen atorvastatin (LIPITOR) 40 MG tablet TAKE 1 TABLET EVERY DAY 90 tablet 2  . clopidogrel (PLAVIX) 75 MG tablet TAKE 1 TABLET EVERY DAY 90 tablet 2  . lisinopril (PRINIVIL,ZESTRIL) 10 MG tablet TAKE 1 TABLET EVERY DAY 90 tablet 2  . metoprolol (LOPRESSOR) 50 MG tablet TAKE 1 TABLET TWICE DAILY 180 tablet 2  . nitroGLYCERIN (NITROSTAT) 0.4 MG SL tablet Place 0.4 mg under the tongue every 5 (five) minutes as needed for chest pain (MAX 3 TABLETS).     . Ranitidine HCl (RANITIDINE 75 PO) Take 1 capsule by mouth at bedtime.     . rivaroxaban (XARELTO) 20 MG TABS tablet Take 1 tablet (20 mg total) by mouth daily with supper. 90 tablet 3   No current facility-administered medications for this visit.     Past Medical History  Diagnosis Date  . Atrial fibrillation   . Ischemic cardiomyopathy   . Coronary atherosclerosis of native coronary artery     diagnosed 12/10   . Acute myocardial infarction of  other anterior wall, episode of care unspecified   . LV (left ventricular) mural thrombus following MI   . ICD (implantable cardiac defibrillator) in place     ROS:   All systems reviewed and negative except as noted in the HPI.   Past Surgical History  Procedure Laterality Date  . Back surgery    . Vasectomy    . Knee surgery    . Shoulder surgery    . Cardiac defibrillator placement      St Jude     Family History  Problem Relation Age of Onset  . Coronary artery disease Mother     Cabd  . Diabetes Mother   . Heart attack Father     died 24     Social History   Social History  . Marital Status: Married    Spouse Name: N/A  . Number of Children: 2  . Years of Education: N/A   Occupational History  . Not on file.   Social History Main Topics  . Smoking status: Former Smoker    Quit date: 01/03/2009  . Smokeless tobacco: Not on file  . Alcohol Use: No  . Drug Use: No  . Sexual Activity: Not on file   Other Topics Concern  . Not on file   Social History Narrative     BP 126/84 mmHg  Pulse 65  Ht  (1.803 m)  Wt 219 lb 3.2 oz (99.428 kg)  BMI 30.59 kg/m2  Physical Exam:  well appearing middle-aged man, NAD HEENT: Unremarkable Neck:  7 cm JVD, no thyromegally Lungs:  Clear with no wheezes, rales, or rhonchi. HEART:  Regular rate rhythm, no murmurs, no rubs, no clicks Abd:  soft, positive bowel sounds, no organomegally, no rebound, no guarding Ext:  2 plus pulses, no edema, no cyanosis, no clubbing Skin:  No rashes no nodules Neuro:  CN II through XII intact, motor grossly intact  DEVICE  Normal device function.  See PaceArt for details.   Assess/Plan:

## 2014-10-03 NOTE — Assessment & Plan Note (Signed)
His St. Jude ICD is working normally with the exception of 3 spurious high impedence readings. Will follow.

## 2014-10-03 NOTE — Assessment & Plan Note (Signed)
His blood pressure is well controlled. No change in meds. He will maintain a low sodium diet. 

## 2014-10-03 NOTE — Patient Instructions (Addendum)
Medication Instructions:  Your physician recommends that you continue on your current medications as directed. Please refer to the Current Medication list given to you today.   Labwork: None ordered  Testing/Procedures: None ordered  Follow-Up: Your physician wants you to follow-up in: 12 months with Dr Taylor You will receive a reminder letter in the mail two months in advance. If you don't receive a letter, please call our office to schedule the follow-up appointment.   Remote monitoring is used to monitor your ICD from home. This monitoring reduces the number of office visits required to check your device to one time per year. It allows us to keep an eye on the functioning of your device to ensure it is working properly. You are scheduled for a device check from home on 01/02/15. You may send your transmission at any time that day. If you have a wireless device, the transmission will be sent automatically. After your physician reviews your transmission, you will receive a postcard with your next transmission date.    Any Other Special Instructions Will Be Listed Below (If Applicable).   

## 2014-10-13 ENCOUNTER — Encounter: Payer: Self-pay | Admitting: Cardiovascular Disease

## 2014-10-13 ENCOUNTER — Ambulatory Visit (INDEPENDENT_AMBULATORY_CARE_PROVIDER_SITE_OTHER): Payer: Medicare HMO | Admitting: Cardiovascular Disease

## 2014-10-13 VITALS — BP 132/90 | HR 63 | Ht 71.0 in | Wt 220.2 lb

## 2014-10-13 DIAGNOSIS — I213 ST elevation (STEMI) myocardial infarction of unspecified site: Secondary | ICD-10-CM

## 2014-10-13 DIAGNOSIS — I10 Essential (primary) hypertension: Secondary | ICD-10-CM

## 2014-10-13 DIAGNOSIS — I255 Ischemic cardiomyopathy: Secondary | ICD-10-CM | POA: Diagnosis not present

## 2014-10-13 DIAGNOSIS — E785 Hyperlipidemia, unspecified: Secondary | ICD-10-CM | POA: Diagnosis not present

## 2014-10-13 DIAGNOSIS — I513 Intracardiac thrombosis, not elsewhere classified: Secondary | ICD-10-CM

## 2014-10-13 DIAGNOSIS — I2510 Atherosclerotic heart disease of native coronary artery without angina pectoris: Secondary | ICD-10-CM

## 2014-10-13 NOTE — Patient Instructions (Signed)
Medication Instructions:  Your physician recommends that you continue on your current medications as directed. Please refer to the Current Medication list given to you today.   Labwork: none  Testing/Procedures: Your physician has requested that you have an exercise tolerance test. For further information please visit [email or web address]. Please also follow instruction sheet, as given.    Follow-Up: Your physician wants you to follow-up in: 12 months.  You will receive a reminder letter in the mail two months in advance. If you don't receive a letter, please call our office to schedule the follow-up appointment.   Any Other Special Instructions Will Be Listed Below (If Applicable).   

## 2014-10-13 NOTE — Progress Notes (Signed)
Chief Complaint  Patient presents with  . Follow-up     History of Present Illness: 59 yo WM with history of tobacco abuse, CAD, ischemic cardiomyopathy, atrial fibrillation who is here today for cardiac follow up. He was admitted 01/22/09 with an acute anterior STEMI with thrombosis of the mid LAD stent placed in 2005. I placed a drug eluting stent in the mid LAD. After his MI, his hospitalization was complicated by atrial fibrillation. He was also found to have severe hypokinesis of the anterior wall on echo and a possible LV thrombus. He was started on coumadin with Lovenox bridge at discharge. He was initially on coumadin but we switched to Pradaxa and then have chosen to continue this to limit the risk of LV thrombus given the fact that his anterior wall is akinetic. Cardiac MRI in the spring of 2011 with EF of 35% and full thickness scar in the apex, distal septum and inferoapex.He has been seen by Dr. Ladona Ridgel and an ICD was placed in August 2011 given his risk profile for sudden cardiac death. He has been followed in our device clinic by Dr. Ladona Ridgel.   He is here today for follow up. No chest pain, SOB, palpitations, near syncope or syncope. He has been taking all meds. He feels great.   Primary Care Physician: Shary Decamp  Last Lipid Profile: Followed in primary care  Past Medical History  Diagnosis Date  . Atrial fibrillation   . Ischemic cardiomyopathy   . Coronary atherosclerosis of native coronary artery     diagnosed 12/10   . Acute myocardial infarction of other anterior wall, episode of care unspecified   . LV (left ventricular) mural thrombus following MI   . ICD (implantable cardiac defibrillator) in place     Past Surgical History  Procedure Laterality Date  . Back surgery    . Vasectomy    . Knee surgery    . Shoulder surgery    . Cardiac defibrillator placement      St Jude    Current Outpatient Prescriptions  Medication Sig Dispense Refill  . acetaminophen  (TYLENOL) 500 MG tablet Take 500 mg by mouth every 6 (six) hours as needed for moderate pain.    Marland Kitchen atorvastatin (LIPITOR) 40 MG tablet Take 40 mg by mouth daily.    . clopidogrel (PLAVIX) 75 MG tablet Take 75 mg by mouth daily.    Marland Kitchen lisinopril (PRINIVIL,ZESTRIL) 10 MG tablet Take 10 mg by mouth daily.    . metoprolol (LOPRESSOR) 50 MG tablet Take 50 mg by mouth 2 (two) times daily.    . nitroGLYCERIN (NITROSTAT) 0.4 MG SL tablet Place 0.4 mg under the tongue every 5 (five) minutes as needed for chest pain (MAX 3 TABLETS).     . Ranitidine HCl (RANITIDINE 75 PO) Take 1 capsule by mouth at bedtime.     . rivaroxaban (XARELTO) 20 MG TABS tablet Take 1 tablet (20 mg total) by mouth daily with supper. 90 tablet 3   No current facility-administered medications for this visit.    No Known Allergies  Social History   Social History  . Marital Status: Married    Spouse Name: N/A  . Number of Children: 2  . Years of Education: N/A   Occupational History  . Not on file.   Social History Main Topics  . Smoking status: Former Smoker    Quit date: 01/03/2009  . Smokeless tobacco: Not on file  . Alcohol Use: No  .  Drug Use: No  . Sexual Activity: Not on file   Other Topics Concern  . Not on file   Social History Narrative    Family History  Problem Relation Age of Onset  . Coronary artery disease Mother     Cabd  . Diabetes Mother   . Heart attack Father     died 83    Review of Systems:  As stated in the HPI and otherwise negative.   BP 132/90 mmHg  Pulse 63  Ht  (1.803 m)  Wt 220 lb 3.2 oz (99.882 kg)  BMI 30.73 kg/m2  SpO2 97%  Physical Examination: General: Well developed, well nourished, NAD HEENT: OP clear, mucus membranes moist SKIN: warm, dry. No rashes. Neuro: No focal deficits Musculoskeletal: Muscle strength 5/5 all ext Psychiatric: Mood and affect normal Neck: No JVD, no carotid bruits, no thyromegaly, no lymphadenopathy. Lungs:Clear bilaterally,  no wheezes, rhonci, crackles Cardiovascular: Regular rate and rhythm. No murmurs, gallops or rubs. Abdomen:Soft. Bowel sounds present. Non-tender.  Extremities: No lower extremity edema. Pulses are 2 + in the bilateral DP/PT.  EKG:  EKG is ordered today. The ekg ordered today demonstrates NSR, rate 63 bpm.   Recent Labs: 08/03/2014: BUN 15; Creatinine, Ser 0.86; Hemoglobin 15.6; Platelets 252.0; Potassium 4.2; Sodium 138   Lipid Panel    Component Value Date/Time   CHOL 154 02/19/2011 0947   TRIG 193.0* 02/19/2011 0947   HDL 38.60* 02/19/2011 0947   CHOLHDL 4 02/19/2011 0947   VLDL 38.6 02/19/2011 0947   LDLCALC 77 02/19/2011 0947   LDLDIRECT 113.4 01/07/2011 1021     Wt Readings from Last 3 Encounters:  10/13/14 220 lb 3.2 oz (99.882 kg)  10/03/14 219 lb 3.2 oz (99.428 kg)  04/04/14 222 lb 6.4 oz (100.88 kg)     Other studies Reviewed: Additional studies/ records that were reviewed today include: . Review of the above records demonstrates:    Assessment and Plan:   1. CAD: Stable. No changes. Continue Plavix, statin, beta blocker, Ace-inhibitor. No ASA secondary to need for Pradaxa and Plavix. Will continue Plavix given his coronary issues, stent and prior thrombosis. Will arrange exercise stress test since he has had no ischemic testing in 5 years. He does not wish to pay for a stress myoview.   2. Ischemic Cardiomyopathy: ICD in place. Volume status is ok. He is on good medical therapy. No changes. Last echo 2011. Ordered in 2015 but pt did not complete. He does not wish to arrange due to cost.   3. MURAL THROMBUS, APEX OF LV :  On Xarelto. He has chosen to continue for now with akinetic apex and risk of future thrombus. He understands increased risk of bleeding on Xarelto and Plavix but I think this is indicated given his apical akinesis.   4. HTN: BP controlled. No changes.   5. HYPERLIPIDEMIA: Continue statin. Lipids followed in primary care  Current medicines are  reviewed at length with the patient today.  The patient does not have concerns regarding medicines.  The following changes have been made:  no change  Labs/ tests ordered today include:   Orders Placed This Encounter  Procedures  . Exercise Tolerance Test  . EKG 12-Lead    Disposition:   FU with me in 12  months  Signed, Verne Carrow, MD 10/13/2014 1:55 PM    [facility] [address] [place], [place], [place]  [address] Phone: [phone]; Fax: [phone]

## 2014-11-10 ENCOUNTER — Encounter: Payer: Medicare HMO | Admitting: Physician Assistant

## 2014-11-22 ENCOUNTER — Encounter: Payer: Self-pay | Admitting: Physician Assistant

## 2014-11-22 ENCOUNTER — Telehealth: Payer: Self-pay | Admitting: *Deleted

## 2014-11-22 ENCOUNTER — Encounter: Payer: Medicare HMO | Admitting: Physician Assistant

## 2014-11-22 NOTE — Telephone Encounter (Signed)
Pt called earlier today to report he took metoprolol this AM.  He was scheduled for stress test with Ronie Spies, PA today at 2:00.  I reviewed with Dayna and pt will need to reschedule due to taking metoprolol.  Pt was notified.  He will call back to reschedule.

## 2014-12-14 ENCOUNTER — Encounter: Payer: Medicare HMO | Admitting: Physician Assistant

## 2014-12-18 ENCOUNTER — Telehealth: Payer: Self-pay

## 2014-12-18 NOTE — Telephone Encounter (Signed)
Pt called to see if we had samples of Xarelto 20 mg  bc they went up to $430 and he needed so to last until Jan. I could only give him 2 bottles and I also gave him the papers for the assist program. Samples are at the check in desk

## 2014-12-19 ENCOUNTER — Encounter: Payer: Self-pay | Admitting: *Deleted

## 2014-12-19 NOTE — Progress Notes (Signed)
Patient presents to office w/ concerns about St.Jude ICD recall. Patient received certified letter in mail and now states that he has questions. Dr.Taylor spoke with patient regarding recall and plans for monitoring these devices. Patient voiced understanding of all information and appreciation for time spent. Plan to follow up as scheduled.

## 2014-12-26 ENCOUNTER — Telehealth: Payer: Self-pay | Admitting: Cardiovascular Disease

## 2014-12-26 NOTE — Telephone Encounter (Signed)
Pt calling requesting samples of Xarelto 20 mg tablet. I informed the pt that we did not have any samples of the Xarelto 20 mg tablets and that he could call back later today to see if we have gotten any samples in. I advised the pt that if he has any other problems, questions or concerns to call the office. Pt verbalized understanding.

## 2014-12-27 ENCOUNTER — Telehealth: Payer: Self-pay | Admitting: Cardiovascular Disease

## 2014-12-27 NOTE — Telephone Encounter (Signed)
Pt calling requesting samples of the Xarelto 20 mg tablets. I informed the pt that I will leave a month supply of the Xarelto 20 mg tablets at the front desk for pt to pick up. I advised the pt that if he has any other problems, questions or concerns to call the office. Pt verbalized understanding.

## 2015-01-02 ENCOUNTER — Ambulatory Visit (INDEPENDENT_AMBULATORY_CARE_PROVIDER_SITE_OTHER): Payer: Medicare HMO | Admitting: *Deleted

## 2015-01-02 DIAGNOSIS — I255 Ischemic cardiomyopathy: Secondary | ICD-10-CM

## 2015-01-03 NOTE — Progress Notes (Signed)
Remote ICD transmission.   

## 2015-01-11 LAB — CUP PACEART REMOTE DEVICE CHECK
Battery Remaining Longevity: 65 mo
Battery Voltage: 2.96 V
Brady Statistic RV Percent Paced: 1 %
Date Time Interrogation Session: 20161129083951
HIGH POWER IMPEDANCE MEASURED VALUE: 52 Ohm
Lead Channel Pacing Threshold Amplitude: 0.75 V
Lead Channel Setting Sensing Sensitivity: 0.5 mV
MDC IDC LEAD IMPLANT DT: 20110826
MDC IDC LEAD LOCATION: 753860
MDC IDC LEAD MODEL: 7121
MDC IDC MSMT BATTERY REMAINING PERCENTAGE: 56 %
MDC IDC MSMT LEADCHNL RV IMPEDANCE VALUE: 540 Ohm
MDC IDC MSMT LEADCHNL RV PACING THRESHOLD PULSEWIDTH: 0.5 ms
MDC IDC MSMT LEADCHNL RV SENSING INTR AMPL: 11.6 mV
MDC IDC SET LEADCHNL RV PACING AMPLITUDE: 2.5 V
MDC IDC SET LEADCHNL RV PACING PULSEWIDTH: 0.5 ms
Pulse Gen Serial Number: 777665

## 2015-01-12 ENCOUNTER — Encounter: Payer: Self-pay | Admitting: Cardiology

## 2015-01-30 ENCOUNTER — Telehealth: Payer: Self-pay | Admitting: *Deleted

## 2015-01-30 NOTE — Telephone Encounter (Signed)
Pt called to get refills.Xarelto 20mg . I am giving him 2 bottles.

## 2015-03-28 ENCOUNTER — Other Ambulatory Visit: Payer: Self-pay | Admitting: Cardiovascular Disease

## 2015-04-03 ENCOUNTER — Ambulatory Visit (INDEPENDENT_AMBULATORY_CARE_PROVIDER_SITE_OTHER): Payer: Medicare HMO | Admitting: *Deleted

## 2015-04-03 DIAGNOSIS — I255 Ischemic cardiomyopathy: Secondary | ICD-10-CM

## 2015-04-03 NOTE — Progress Notes (Signed)
Remote ICD transmission.   

## 2015-05-04 ENCOUNTER — Encounter: Payer: Self-pay | Admitting: Cardiology

## 2015-05-04 LAB — CUP PACEART REMOTE DEVICE CHECK
Battery Remaining Longevity: 62 mo
Battery Remaining Percentage: 54 %
HighPow Impedance: 58 Ohm
Implantable Lead Location: 753860
Lead Channel Sensing Intrinsic Amplitude: 11.5 mV
Lead Channel Setting Pacing Amplitude: 2.5 V
Lead Channel Setting Pacing Pulse Width: 0.5 ms
Lead Channel Setting Sensing Sensitivity: 0.5 mV
MDC IDC LEAD IMPLANT DT: 20110826
MDC IDC LEAD MODEL: 7121
MDC IDC MSMT BATTERY VOLTAGE: 2.95 V
MDC IDC MSMT LEADCHNL RV IMPEDANCE VALUE: 560 Ohm
MDC IDC MSMT LEADCHNL RV PACING THRESHOLD AMPLITUDE: 0.75 V
MDC IDC MSMT LEADCHNL RV PACING THRESHOLD PULSEWIDTH: 0.5 ms
MDC IDC PG SERIAL: 777665
MDC IDC SESS DTM: 20170228070657
MDC IDC STAT BRADY RV PERCENT PACED: 1 %

## 2015-06-03 ENCOUNTER — Encounter: Payer: Self-pay | Admitting: Internal Medicine

## 2015-06-25 ENCOUNTER — Telehealth: Payer: Self-pay | Admitting: Cardiovascular Disease

## 2015-06-25 NOTE — Telephone Encounter (Signed)
New message      Pt has a defibulator.  Lately he has been anxious because of a family issue. Today he feels fatigued.  Talk to the nurse about his fatigue not defibulator

## 2015-06-25 NOTE — Telephone Encounter (Signed)
I do not see documentation on file that we may speak with pt's wife. I spoke with pt. He was unaware his wife called but did give me verbal permission to speak with her. He will fill out DPR next time he is in office. Pt reports he had stressful weekend with his children.  He reports he had anxiety with this but otherwise is feeling fine.  Does not feel like he needs an appointment at this time.  He is due for follow up with Dr. Clifton James in September and appt made for September 15,2017.  I told pt I was not going to return call to this wife since he reports he is feeling fine. He is agreeable with this.  I asked pt to contact us if he has problems prior to planned appt.

## 2015-06-26 ENCOUNTER — Telehealth: Payer: Self-pay | Admitting: Cardiology

## 2015-06-26 NOTE — Telephone Encounter (Signed)
Spoke w/ pt and requested that he send a manual transmission b/c his home monitor has not updated in at least 8 days.   

## 2015-06-27 ENCOUNTER — Telehealth: Payer: Self-pay | Admitting: Internal Medicine

## 2015-06-27 NOTE — Telephone Encounter (Signed)
Returned patient's call.  Advised that his transmission is not due until 07/03/15 and that since his monitor is up to date, it should transmit automatically.  Advised that I reviewed his transmission that he sent today and it shows no episodes and his device function is WNL for him.  Patient verbalizes understanding and appreciation.  He denies additional questions at this time.

## 2015-06-27 NOTE — Telephone Encounter (Signed)
New Message  Pt called states that he believes he got his remote device to work. He request a call back to determine if everything looks correct on your end.

## 2015-07-03 ENCOUNTER — Ambulatory Visit (INDEPENDENT_AMBULATORY_CARE_PROVIDER_SITE_OTHER): Payer: Medicare HMO | Admitting: *Deleted

## 2015-07-03 DIAGNOSIS — I255 Ischemic cardiomyopathy: Secondary | ICD-10-CM

## 2015-07-04 NOTE — Progress Notes (Signed)
Remote ICD transmission.   

## 2015-07-19 LAB — CUP PACEART REMOTE DEVICE CHECK
Battery Remaining Longevity: 62 mo
Battery Remaining Percentage: 52 %
HighPow Impedance: 58 Ohm
Implantable Lead Location: 753860
Implantable Lead Model: 7121
Lead Channel Setting Pacing Amplitude: 2.5 V
Lead Channel Setting Pacing Pulse Width: 0.5 ms
Lead Channel Setting Sensing Sensitivity: 0.5 mV
MDC IDC LEAD IMPLANT DT: 20110826
MDC IDC MSMT BATTERY VOLTAGE: 2.95 V
MDC IDC MSMT LEADCHNL RV IMPEDANCE VALUE: 1300 Ohm
MDC IDC MSMT LEADCHNL RV PACING THRESHOLD AMPLITUDE: 0.75 V
MDC IDC MSMT LEADCHNL RV PACING THRESHOLD PULSEWIDTH: 0.5 ms
MDC IDC MSMT LEADCHNL RV SENSING INTR AMPL: 11.5 mV
MDC IDC PG SERIAL: 777665
MDC IDC SESS DTM: 20170530073931
MDC IDC STAT BRADY RV PERCENT PACED: 1 %

## 2015-07-24 ENCOUNTER — Encounter: Payer: Self-pay | Admitting: Cardiology

## 2015-08-29 ENCOUNTER — Other Ambulatory Visit: Payer: Self-pay | Admitting: Cardiovascular Disease

## 2015-10-04 ENCOUNTER — Ambulatory Visit (INDEPENDENT_AMBULATORY_CARE_PROVIDER_SITE_OTHER): Payer: Medicare HMO | Admitting: Internal Medicine

## 2015-10-04 ENCOUNTER — Encounter: Payer: Self-pay | Admitting: Internal Medicine

## 2015-10-04 VITALS — BP 126/64 | HR 66 | Ht 71.0 in | Wt 219.6 lb

## 2015-10-04 DIAGNOSIS — Z95810 Presence of automatic (implantable) cardiac defibrillator: Secondary | ICD-10-CM | POA: Diagnosis not present

## 2015-10-04 DIAGNOSIS — I255 Ischemic cardiomyopathy: Secondary | ICD-10-CM

## 2015-10-04 NOTE — Patient Instructions (Signed)
Medication Instructions:  Your physician recommends that you continue on your current medications as directed. Please refer to the Current Medication list given to you today.   Labwork: None ordered   Testing/Procedures: None ordered   Follow-Up: Your physician wants you to follow-up in: 12 months with Dr Taylor You will receive a reminder letter in the mail two months in advance. If you don't receive a letter, please call our office to schedule the follow-up appointment.   Remote monitoring is used to monitor your ICD from home. This monitoring reduces the number of office visits required to check your device to one time per year. It allows us to keep an eye on the functioning of your device to ensure it is working properly. You are scheduled for a device check from home on 01/03/16. You may send your transmission at any time that day. If you have a wireless device, the transmission will be sent automatically. After your physician reviews your transmission, you will receive a postcard with your next transmission date.    Any Other Special Instructions Will Be Listed Below (If Applicable).     If you need a refill on your cardiac medications before your next appointment, please call your pharmacy.   

## 2015-10-04 NOTE — Progress Notes (Signed)
HPI [name] returns today for followup. He is a pleasant 60 year old man with an ischemic cardiomyopathy, status post anterior myocardial infarction in 2005, with stent restenosis in 2010, status post insertion of a drug-eluting stent at that time. He has some point developed severe left ventricular dysfunction, and paroxysmal atrial fibrillation. He is status post ICD implantation. He is maintained on anticoagulation with both Plavix and Pradaxa. He has had no bleeding. He denies easy bruisability. No chest pain. No peripheral edema. He remains active. The patient received an alert on his ICD and has had 4 isolated RV pacing impedance elevations. All subsequent evaluations have been normal and a chest x-ray demonstrated no evidence of lead fracture. No sensing or pacing issues. Overall he feels well. No Known Allergies   Current Outpatient Prescriptions  Medication Sig Dispense Refill  . acetaminophen (TYLENOL) 500 MG tablet Take 500 mg by mouth every 6 (six) hours as needed for moderate pain.    Marland Kitchen atorvastatin (LIPITOR) 40 MG tablet Take 40 mg by mouth daily.    . clopidogrel (PLAVIX) 75 MG tablet Take 75 mg by mouth daily.    Marland Kitchen lisinopril (PRINIVIL,ZESTRIL) 10 MG tablet Take 10 mg by mouth daily.    . metoprolol (LOPRESSOR) 50 MG tablet Take 50 mg by mouth 2 (two) times daily.    . nitroGLYCERIN (NITROSTAT) 0.4 MG SL tablet Place 0.4 mg under the tongue every 5 (five) minutes as needed for chest pain (MAX 3 TABLETS).     Marland Kitchen omeprazole (PRILOSEC) 40 MG capsule Take 1 capsule by mouth daily.    Carlena Hurl 20 MG TABS tablet TAKE 1 TABLET EVERY DAY WITH SUPPER (STOP PRADAXA) 90 tablet 0   No current facility-administered medications for this visit.      Past Medical History:  Diagnosis Date  . Acute myocardial infarction of other anterior wall, episode of care unspecified   . Atrial fibrillation (HCC)   . Coronary atherosclerosis of native coronary artery    diagnosed 12/10   . ICD  (implantable cardiac defibrillator) in place   . Ischemic cardiomyopathy   . 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   Past Surgical History:  Procedure Laterality Date  . BACK SURGERY    . CARDIAC DEFIBRILLATOR PLACEMENT     [employer]  . KNEE SURGERY    . SHOULDER SURGERY    . VASECTOMY       Family History  Problem Relation Age of Onset  . Coronary artery disease Mother     Cabd  . Diabetes Mother   . Heart attack Father     died 12     Social History   Social History  . Marital status: Married    Spouse name: N/A  . Number of children: 2  . Years of education: N/A   Occupational History  . Not on file.   Social History Main Topics  . Smoking status: Former Smoker    Quit date: 01/03/2009  . Smokeless tobacco: Not on file  . Alcohol use No  . Drug use: No  . Sexual activity: Not on file   Other Topics Concern  . Not on file   Social History Narrative  . No narrative on file     BP 126/64   Pulse 66   Ht 5\' 11"  (1.803 m)   Wt 219 lb 9.6 oz (99.6 kg)  BMI 30.63 kg/m   Physical Exam: well appearing middle-aged man, NAD HEENT: Unremarkable Neck:  7 cm JVD, no thyromegally Lungs:  Clear with no wheezes, rales, or rhonchi. HEART:  Regular rate rhythm, no murmurs, no rubs, no clicks Abd:  soft, positive bowel sounds, no organomegally, no rebound, no guarding Ext:  2 plus pulses, no edema, no cyanosis, no clubbing Skin:  No rashes no nodules Neuro:  CN II through XII intact, motor grossly intact  DEVICE  Normal device function.  See PaceArt for details.   Assess/Plan: 1. ICM - he denies chest pain or sob. He remains active.  2. chroic systolic heart failure - his symptoms remain class 2. He will continue his current meds. 3. ICD - he continues to have isolated elevated impedence on rate/sense leads. We discussed his impedence elevations. I have  recommended watchful waiting. If we see any noise or elevated pacing thresholds or persistent lead impedence problems, will consider ICD lead revision. 4. HTN - his blood pressure is controlled. Will follow.  Leonia ReevesGregg Taylor,M.D.

## 2015-10-19 ENCOUNTER — Encounter: Payer: Self-pay | Admitting: Cardiovascular Disease

## 2015-10-19 ENCOUNTER — Ambulatory Visit (INDEPENDENT_AMBULATORY_CARE_PROVIDER_SITE_OTHER): Payer: Medicare HMO | Admitting: Cardiovascular Disease

## 2015-10-19 VITALS — BP 110/80 | HR 70 | Ht 71.0 in | Wt 216.2 lb

## 2015-10-19 DIAGNOSIS — I513 Intracardiac thrombosis, not elsewhere classified: Secondary | ICD-10-CM

## 2015-10-19 DIAGNOSIS — E785 Hyperlipidemia, unspecified: Secondary | ICD-10-CM

## 2015-10-19 DIAGNOSIS — I255 Ischemic cardiomyopathy: Secondary | ICD-10-CM | POA: Diagnosis not present

## 2015-10-19 DIAGNOSIS — I2510 Atherosclerotic heart disease of native coronary artery without angina pectoris: Secondary | ICD-10-CM

## 2015-10-19 DIAGNOSIS — I10 Essential (primary) hypertension: Secondary | ICD-10-CM | POA: Diagnosis not present

## 2015-10-19 DIAGNOSIS — I213 ST elevation (STEMI) myocardial infarction of unspecified site: Secondary | ICD-10-CM

## 2015-10-19 LAB — CBC
HCT: 48.5 % (ref 38.5–50.0)
HEMOGLOBIN: 15.9 g/dL (ref 13.2–17.1)
MCH: 29.4 pg (ref 27.0–33.0)
MCHC: 32.8 g/dL (ref 32.0–36.0)
MCV: 89.6 fL (ref 80.0–100.0)
MPV: 11 fL (ref 7.5–12.5)
PLATELETS: 293 10*3/uL (ref 140–400)
RBC: 5.41 MIL/uL (ref 4.20–5.80)
RDW: 14.2 % (ref 11.0–15.0)
WBC: 7.6 10*3/uL (ref 3.8–10.8)

## 2015-10-19 LAB — COMPREHENSIVE METABOLIC PANEL
ALT: 44 U/L (ref 9–46)
AST: 30 U/L (ref 10–35)
Albumin: 4.6 g/dL (ref 3.6–5.1)
Alkaline Phosphatase: 77 U/L (ref 40–115)
BILIRUBIN TOTAL: 0.5 mg/dL (ref 0.2–1.2)
BUN: 13 mg/dL (ref 7–25)
CHLORIDE: 103 mmol/L (ref 98–110)
CO2: 23 mmol/L (ref 20–31)
CREATININE: 0.97 mg/dL (ref 0.70–1.25)
Calcium: 9.6 mg/dL (ref 8.6–10.3)
GLUCOSE: 198 mg/dL — AB (ref 65–99)
Potassium: 4.3 mmol/L (ref 3.5–5.3)
SODIUM: 139 mmol/L (ref 135–146)
Total Protein: 7 g/dL (ref 6.1–8.1)

## 2015-10-19 LAB — LIPID PANEL
Cholesterol: 166 mg/dL (ref 125–200)
HDL: 37 mg/dL — ABNORMAL LOW (ref >=40)
LDL CALC: 79 mg/dL (ref <130)
Total CHOL/HDL Ratio: 4.5 Ratio (ref <=5.0)
Triglycerides: 250 mg/dL — ABNORMAL HIGH (ref <150)
VLDL: 50 mg/dL — AB (ref <30)

## 2015-10-19 MED ORDER — ATORVASTATIN CALCIUM 40 MG PO TABS: 40.0000 mg | ORAL_TABLET | Freq: Every day | ORAL | 3 refills | Status: DC

## 2015-10-19 MED ORDER — LISINOPRIL 10 MG PO TABS: 10.0000 mg | ORAL_TABLET | Freq: Every day | ORAL | 3 refills | Status: DC

## 2015-10-19 MED ORDER — METOPROLOL TARTRATE 50 MG PO TABS: 50.0000 mg | ORAL_TABLET | Freq: Two times a day (BID) | ORAL | 3 refills | Status: DC

## 2015-10-19 MED ORDER — CLOPIDOGREL BISULFATE 75 MG PO TABS: 75.0000 mg | ORAL_TABLET | Freq: Every day | ORAL | 3 refills | Status: DC

## 2015-10-19 NOTE — Patient Instructions (Signed)
Medication Instructions:  Your physician recommends that you continue on your current medications as directed. Please refer to the Current Medication list given to you today.   Labwork: Lab work to be done today--CMET, Lipid profile, CBC  Testing/Procedures: none  Follow-Up: Your physician wants you to follow-up in: 12 months.  You will receive a reminder letter in the mail two months in advance. If you don't receive a letter, please call our office to schedule the follow-up appointment.   Any Other Special Instructions Will Be Listed Below (If Applicable).     If you need a refill on your cardiac medications before your next appointment, please call your pharmacy.

## 2015-10-19 NOTE — Progress Notes (Signed)
Chief Complaint  Patient presents with  . Coronary Artery Disease     History of Present Illness: 60 yo WM with history of former tobacco abuse, CAD, ischemic cardiomyopathy, atrial fibrillation who is here today for cardiac follow up. He was admitted 01/22/09 with an acute anterior STEMI with thrombosis of the mid LAD stent placed in 2005. I placed a drug eluting stent in the mid LAD. After his MI, his hospitalization was complicated by atrial fibrillation. He was also found to have severe hypokinesis of the anterior wall on echo and a possible LV thrombus. He has been anti-coagulated to limit the risk of LV thrombus given the fact that his anterior wall is akinetic. Cardiac MRI in the spring of 2011 with EF of 35% and full thickness scar in the apex, distal septum and inferoapex.He has been seen by Dr. Lovena Le and an ICD was placed in August 2011 given his risk profile for sudden cardiac death. He has been followed in our device clinic by Dr. Lovena Le.   He is here today for follow up. No chest pain, SOB, palpitations, near syncope or syncope. He has been taking all meds. He feels great.   Primary Care Physician: Gilford Rile, MD  Past Medical History:  Diagnosis Date  . Acute myocardial infarction of other anterior wall, episode of care unspecified   . Atrial fibrillation ([place])   . Coronary atherosclerosis of native coronary artery    diagnosed 12/10   . ICD (implantable cardiac defibrillator) in place   . Ischemic cardiomyopathy   . LV (left ventricular) mural thrombus following MI [facility])     Past Surgical History:  Procedure Laterality Date  . BACK SURGERY    . CARDIAC DEFIBRILLATOR PLACEMENT     [employer] Jude  . KNEE SURGERY    . SHOULDER SURGERY    . VASECTOMY      Current Outpatient Prescriptions  Medication Sig Dispense Refill  . acetaminophen (TYLENOL) 500 MG tablet Take 500 mg by mouth every 6 (six) hours as needed for moderate pain.    Marland Kitchen atorvastatin (LIPITOR) 40 MG tablet  Take 1 tablet (40 mg total) by mouth daily. 90 tablet 3  . clopidogrel (PLAVIX) 75 MG tablet Take 1 tablet (75 mg total) by mouth daily. 90 tablet 3  . lisinopril (PRINIVIL,ZESTRIL) 10 MG tablet Take 1 tablet (10 mg total) by mouth daily. 90 tablet 3  . metoprolol (LOPRESSOR) 50 MG tablet Take 1 tablet (50 mg total) by mouth 2 (two) times daily. 180 tablet 3  . nitroGLYCERIN (NITROSTAT) 0.4 MG SL tablet Place 0.4 mg under the tongue every 5 (five) minutes as needed for chest pain (MAX 3 TABLETS).     Marland Kitchen omeprazole (PRILOSEC) 40 MG capsule Take 1 capsule by mouth daily.    . rivaroxaban (XARELTO) 20 MG TABS tablet Take 20 mg by mouth daily with supper.     No current facility-administered medications for this visit.     No Known Allergies  Social History   Social History  . Marital status: Married    Spouse name: N/A  . Number of children: 2  . Years of education: N/A   Occupational History  . Not on file.   Social History Main Topics  . Smoking status: Former Smoker    Quit date: 01/03/2009  . Smokeless tobacco: Not on file  . Alcohol use No  . Drug use: No  . Sexual activity: Not on file   Other Topics Concern  .  Not on file   Social History Narrative  . No narrative on file    Family History  Problem Relation Age of Onset  . Coronary artery disease Mother     Cabd  . Diabetes Mother   . Heart attack Father     died 18    Review of Systems:  As stated in the HPI and otherwise negative.   BP 110/80   Pulse 70   Ht '5\' 11"'$  (1.803 m)   Wt 216 lb 3.2 oz (98.1 kg)   BMI 30.15 kg/m   Physical Examination: General: Well developed, well nourished, NAD  HEENT: OP clear, mucus membranes moist  SKIN: warm, dry. No rashes. Neuro: No focal deficits  Musculoskeletal: Muscle strength 5/5 all ext  Psychiatric: Mood and affect normal  Neck: No JVD, no carotid bruits, no thyromegaly, no lymphadenopathy.  Lungs:Clear bilaterally, no wheezes, rhonci,  crackles Cardiovascular: Regular rate and rhythm. No murmurs, gallops or rubs. Abdomen:Soft. Bowel sounds present. Non-tender.  Extremities: No lower extremity edema. Pulses are 2 + in the bilateral DP/PT.  EKG:  EKG is  ordered today. The ekg ordered today demonstrates NSR, rate 70 bpm. LAFB.   Recent Labs: No results found for requested labs within last 8760 hours.   Lipid Panel Followed in primary care   Wt Readings from Last 3 Encounters:  10/19/15 216 lb 3.2 oz (98.1 kg)  10/04/15 219 lb 9.6 oz (99.6 kg)  10/13/14 220 lb 3.2 oz (99.9 kg)     Other studies Reviewed: Additional studies/ records that were reviewed today include: . Review of the above records demonstrates:    Assessment and Plan:   1. CAD: Stable. No changes. Continue Plavix, statin, beta blocker, Ace-inhibitor. No ASA secondary to need for Xarelto and Plavix. Will continue Plavix given his coronary issues, stent and prior thrombosis. I arranged a stress test in 2016 but this was not completed. He does not wish to undergo stress testing at this time due to cost.    2. Ischemic Cardiomyopathy: ICD in place. Volume status is ok. He is on good medical therapy. No changes. Last echo 2011. Ordered in 2015 but pt did not complete. He does not wish to arrange due to cost.   3. MURAL THROMBUS, APEX OF LV :  On Xarelto. He has chosen to continue for now with akinetic apex and risk of future thrombus. He understands increased risk of bleeding on Xarelto and Plavix but I think this is indicated given his apical akinesis. Will check BMET and CBC today.   4. HTN: BP controlled. No changes.   5. HYPERLIPIDEMIA: Continue statin. Will check lipids and LFTs today.   Current medicines are reviewed at length with the patient today.  The patient does not have concerns regarding medicines.  The following changes have been made:  no change  Labs/ tests ordered today include:   Orders Placed This Encounter  Procedures  . Comp  Met (CMET)  . Lipid Profile  . CBC  . EKG 12-Lead    Disposition:   FU with me in 12  months  Signed, Lauree Chandler, MD 10/19/2015 9:39 AM    [facility] [address], [place], [place]  [address] Phone: [phone]; Fax: [phone]

## 2015-11-23 ENCOUNTER — Telehealth: Payer: Self-pay | Admitting: *Deleted

## 2015-11-23 NOTE — Telephone Encounter (Signed)
Patient requested xarelto samples. I will place some at the front desk for him.

## 2015-12-14 ENCOUNTER — Telehealth: Payer: Self-pay | Admitting: Cardiovascular Disease

## 2015-12-14 NOTE — Telephone Encounter (Signed)
Samples placed at the front desk. 

## 2015-12-14 NOTE — Telephone Encounter (Signed)
Pt called for XARELTO samples.   Per refill will have some at the put up front for pt.  Pt said thank you!

## 2015-12-19 ENCOUNTER — Telehealth: Payer: Self-pay | Admitting: *Deleted

## 2015-12-19 NOTE — Telephone Encounter (Signed)
Received walk in pt form. I spoke with pt. He is concerned about defibrillator recall.  Pt states he discussed with Dr. Clifton James at last office visit and Dr. Clifton James was going to discuss with Dr. Ladona Ridgel.  Pt is calling to see if Dr. Clifton James has talked with Dr. Ladona Ridgel.  Pt also requesting samples of Xarelto.  He is currently in doughnut hole but will be able to afford after the first of the year.  He currently has a 2 week supply left.

## 2015-12-20 NOTE — Telephone Encounter (Signed)
Late entry:  LMTCB/sss

## 2015-12-20 NOTE — Telephone Encounter (Signed)
Pat, Can you route this to Dr. Lubertha Basque nurse. I think it would be best for any questions about the ICD to be handled by EP. Thayer Ohm

## 2015-12-21 NOTE — Telephone Encounter (Signed)
Spoke to patient about device recall. Patient states that he is still feeling uneasy about his ICD recall. I explained to him that [employer] is still recommending remote follow up for the devices. I explained to him that [employer] has updated their website to give [place] a battery performance alert sooner that we would have previously received it. I also explained to him that if we were to get any additional recommendations we would let him know. Patient voiced understanding.  Patient was reminded to call if he ever feels his vibratory alert.

## 2015-12-31 ENCOUNTER — Telehealth: Payer: Self-pay | Admitting: Cardiovascular Disease

## 2015-12-31 MED ORDER — RIVAROXABAN 20 MG PO TABS: 20.0000 mg | ORAL_TABLET | Freq: Every day | ORAL | 0 refills | Status: DC

## 2015-12-31 NOTE — Telephone Encounter (Signed)
SENT IN 30 DAY TO LOCAL PHARMACY, PT WILL PICK UP 30 DAY FREE TRIAL & 0 CO PAY CARD, &  CALL IF HE NEEDS ANY FURTHER HELP UNTIL 1ST OF YEAR. PT EXPRESSED UNDERSTANDING OF PLAN.

## 2015-12-31 NOTE — Telephone Encounter (Signed)
New Message  Patient calling the office for samples of medication:   1.  What medication and dosage are you requesting samples for? xarelto 20 mg tablets take a supper daily  2.  Are you currently out of this medication?  Pt has one pill left

## 2015-12-31 NOTE — Telephone Encounter (Signed)
pt called for samples, we do not have samples to give, he was turned down for PA, never has a RX been sent in for him, will talk to Charlotte Crumb, we can not supply samples for the life of the medication, this was explained to pt. He wanted to speak with Dennie Bible, I will inform her of this conversation and that an alternative will have to be discussed with pt as we can not supply samples for the life of the medication.

## 2016-01-03 ENCOUNTER — Ambulatory Visit (INDEPENDENT_AMBULATORY_CARE_PROVIDER_SITE_OTHER): Payer: Medicare HMO | Admitting: *Deleted

## 2016-01-03 DIAGNOSIS — I255 Ischemic cardiomyopathy: Secondary | ICD-10-CM | POA: Diagnosis not present

## 2016-01-03 NOTE — Progress Notes (Signed)
Remote ICD transmission.   

## 2016-01-15 ENCOUNTER — Encounter: Payer: Self-pay | Admitting: Cardiology

## 2016-02-05 ENCOUNTER — Other Ambulatory Visit: Payer: Self-pay | Admitting: Cardiovascular Disease

## 2016-02-13 LAB — CUP PACEART REMOTE DEVICE CHECK
Date Time Interrogation Session: 20171130143855
HighPow Impedance: 58 Ohm
Implantable Lead Location: 753860
Lead Channel Pacing Threshold Pulse Width: 0.5 ms
Lead Channel Sensing Intrinsic Amplitude: 11.5 mV
Lead Channel Setting Pacing Pulse Width: 0.5 ms
Lead Channel Setting Sensing Sensitivity: 0.5 mV
MDC IDC LEAD IMPLANT DT: 20110826
MDC IDC LEAD MODEL: 7121
MDC IDC MSMT BATTERY REMAINING LONGEVITY: 58 mo
MDC IDC MSMT BATTERY REMAINING PERCENTAGE: 48 %
MDC IDC MSMT BATTERY VOLTAGE: 2.93 V
MDC IDC MSMT LEADCHNL RV IMPEDANCE VALUE: 780 Ohm
MDC IDC MSMT LEADCHNL RV PACING THRESHOLD AMPLITUDE: 0.75 V
MDC IDC PG IMPLANT DT: 20110826
MDC IDC PG SERIAL: 777665
MDC IDC SET LEADCHNL RV PACING AMPLITUDE: 2.5 V
MDC IDC STAT BRADY RV PERCENT PACED: 1 %

## 2016-04-03 ENCOUNTER — Ambulatory Visit (INDEPENDENT_AMBULATORY_CARE_PROVIDER_SITE_OTHER): Payer: PPO | Admitting: *Deleted

## 2016-04-03 DIAGNOSIS — I255 Ischemic cardiomyopathy: Secondary | ICD-10-CM | POA: Diagnosis not present

## 2016-04-03 NOTE — Progress Notes (Signed)
Remote ICD transmission.   

## 2016-04-08 ENCOUNTER — Encounter: Payer: Self-pay | Admitting: Cardiology

## 2016-04-08 LAB — CUP PACEART REMOTE DEVICE CHECK
Battery Remaining Percentage: 46 %
HighPow Impedance: 58 Ohm
Implantable Lead Location: 753860
Implantable Pulse Generator Implant Date: 20110826
Lead Channel Sensing Intrinsic Amplitude: 11.5 mV
Lead Channel Setting Pacing Amplitude: 2.5 V
Lead Channel Setting Sensing Sensitivity: 0.5 mV
MDC IDC LEAD IMPLANT DT: 20110826
MDC IDC MSMT BATTERY REMAINING LONGEVITY: 54 mo
MDC IDC MSMT BATTERY VOLTAGE: 2.93 V
MDC IDC MSMT LEADCHNL RV IMPEDANCE VALUE: 580 Ohm
MDC IDC MSMT LEADCHNL RV PACING THRESHOLD AMPLITUDE: 0.75 V
MDC IDC MSMT LEADCHNL RV PACING THRESHOLD PULSEWIDTH: 0.5 ms
MDC IDC SESS DTM: 20180301090441
MDC IDC SET LEADCHNL RV PACING PULSEWIDTH: 0.5 ms
MDC IDC STAT BRADY RV PERCENT PACED: 1 %
Pulse Gen Serial Number: 777665

## 2016-04-30 DIAGNOSIS — R5383 Other fatigue: Secondary | ICD-10-CM | POA: Diagnosis not present

## 2016-04-30 DIAGNOSIS — I4891 Unspecified atrial fibrillation: Secondary | ICD-10-CM | POA: Diagnosis not present

## 2016-04-30 DIAGNOSIS — M1990 Unspecified osteoarthritis, unspecified site: Secondary | ICD-10-CM | POA: Diagnosis not present

## 2016-04-30 DIAGNOSIS — K219 Gastro-esophageal reflux disease without esophagitis: Secondary | ICD-10-CM | POA: Diagnosis not present

## 2016-04-30 DIAGNOSIS — E782 Mixed hyperlipidemia: Secondary | ICD-10-CM | POA: Diagnosis not present

## 2016-04-30 DIAGNOSIS — E291 Testicular hypofunction: Secondary | ICD-10-CM | POA: Diagnosis not present

## 2016-04-30 DIAGNOSIS — Z95810 Presence of automatic (implantable) cardiac defibrillator: Secondary | ICD-10-CM | POA: Diagnosis not present

## 2016-04-30 DIAGNOSIS — I10 Essential (primary) hypertension: Secondary | ICD-10-CM | POA: Diagnosis not present

## 2016-04-30 DIAGNOSIS — D751 Secondary polycythemia: Secondary | ICD-10-CM | POA: Diagnosis not present

## 2016-04-30 DIAGNOSIS — I2510 Atherosclerotic heart disease of native coronary artery without angina pectoris: Secondary | ICD-10-CM | POA: Diagnosis not present

## 2016-04-30 DIAGNOSIS — R5381 Other malaise: Secondary | ICD-10-CM | POA: Diagnosis not present

## 2016-04-30 DIAGNOSIS — R7303 Prediabetes: Secondary | ICD-10-CM | POA: Diagnosis not present

## 2016-07-03 ENCOUNTER — Ambulatory Visit (INDEPENDENT_AMBULATORY_CARE_PROVIDER_SITE_OTHER): Payer: PPO | Admitting: *Deleted

## 2016-07-03 DIAGNOSIS — I255 Ischemic cardiomyopathy: Secondary | ICD-10-CM

## 2016-07-04 NOTE — Progress Notes (Signed)
Remote ICD transmission.   

## 2016-07-10 LAB — CUP PACEART REMOTE DEVICE CHECK
Battery Remaining Percentage: 44 %
Battery Voltage: 2.93 V
HIGH POWER IMPEDANCE MEASURED VALUE: 58 Ohm
Implantable Pulse Generator Implant Date: 20110826
Lead Channel Impedance Value: 1575 Ohm
Lead Channel Sensing Intrinsic Amplitude: 11.5 mV
Lead Channel Setting Pacing Amplitude: 2.5 V
Lead Channel Setting Sensing Sensitivity: 0.5 mV
MDC IDC LEAD IMPLANT DT: 20110826
MDC IDC LEAD LOCATION: 753860
MDC IDC MSMT BATTERY REMAINING LONGEVITY: 54 mo
MDC IDC MSMT LEADCHNL RV PACING THRESHOLD AMPLITUDE: 0.75 V
MDC IDC MSMT LEADCHNL RV PACING THRESHOLD PULSEWIDTH: 0.5 ms
MDC IDC PG SERIAL: 777665
MDC IDC SESS DTM: 20180531093749
MDC IDC SET LEADCHNL RV PACING PULSEWIDTH: 0.5 ms
MDC IDC STAT BRADY RV PERCENT PACED: 1 %

## 2016-07-11 ENCOUNTER — Encounter: Payer: Self-pay | Admitting: Cardiology

## 2016-08-13 ENCOUNTER — Other Ambulatory Visit: Payer: Self-pay

## 2016-08-13 NOTE — Patient Outreach (Signed)
    Successful telephone contact with patient for HTA Telephone Screening. Patient denies any case management needs at this time, however, he was agreeable to receiving information via mail list listing [facility] Programs

## 2016-08-26 ENCOUNTER — Other Ambulatory Visit: Payer: Self-pay | Admitting: Cardiovascular Disease

## 2016-08-26 NOTE — Telephone Encounter (Signed)
Age 61 years 10/19/2015 Saw Dr Clifton James  04/30/2016 Wt 99.8kg 04/30/2016 Hgb 15.5 HCT 45.7 04/30/2016 SrCr 0.76 CrCl 141.63  Refill done for Xarelto 20 mg daily

## 2016-09-22 DIAGNOSIS — I4891 Unspecified atrial fibrillation: Secondary | ICD-10-CM | POA: Diagnosis not present

## 2016-09-22 DIAGNOSIS — R7303 Prediabetes: Secondary | ICD-10-CM | POA: Diagnosis not present

## 2016-09-22 DIAGNOSIS — M549 Dorsalgia, unspecified: Secondary | ICD-10-CM | POA: Diagnosis not present

## 2016-10-02 ENCOUNTER — Ambulatory Visit (INDEPENDENT_AMBULATORY_CARE_PROVIDER_SITE_OTHER): Payer: PPO | Admitting: *Deleted

## 2016-10-02 DIAGNOSIS — I255 Ischemic cardiomyopathy: Secondary | ICD-10-CM | POA: Diagnosis not present

## 2016-10-02 NOTE — Progress Notes (Signed)
Remote ICD transmission.   

## 2016-10-14 ENCOUNTER — Encounter: Payer: Self-pay | Admitting: Cardiology

## 2016-10-23 ENCOUNTER — Other Ambulatory Visit: Payer: Self-pay | Admitting: Cardiovascular Disease

## 2016-10-23 MED ORDER — LISINOPRIL 10 MG PO TABS: 10.0000 mg | ORAL_TABLET | Freq: Every day | ORAL | 3 refills | Status: DC

## 2016-10-23 MED ORDER — RIVAROXABAN 20 MG PO TABS: ORAL_TABLET | ORAL | 5 refills | Status: DC

## 2016-10-23 MED ORDER — ATORVASTATIN CALCIUM 40 MG PO TABS: 40.0000 mg | ORAL_TABLET | Freq: Every day | ORAL | 0 refills | Status: DC

## 2016-10-23 MED ORDER — METOPROLOL TARTRATE 50 MG PO TABS: 50.0000 mg | ORAL_TABLET | Freq: Two times a day (BID) | ORAL | 0 refills | Status: DC

## 2016-10-23 MED ORDER — CLOPIDOGREL BISULFATE 75 MG PO TABS
75.0000 mg | ORAL_TABLET | Freq: Every day | ORAL | 0 refills | Status: DC
Start: 2016-10-23 — End: 2017-02-12

## 2016-10-28 LAB — CUP PACEART REMOTE DEVICE CHECK
MDC IDC LEAD IMPLANT DT: 20110826
MDC IDC LEAD LOCATION: 753860
MDC IDC PG IMPLANT DT: 20110826
MDC IDC SESS DTM: 20180925090851
Pulse Gen Serial Number: 777665

## 2016-11-20 ENCOUNTER — Telehealth: Payer: Self-pay | Admitting: *Deleted

## 2016-11-20 ENCOUNTER — Telehealth: Payer: Self-pay | Admitting: Cardiovascular Disease

## 2016-11-20 NOTE — Telephone Encounter (Signed)
Have received completed provider form with Dr Karie Schwalbe signature. Will place in yellow folder awaiting the patient to complete her information.

## 2016-11-20 NOTE — Telephone Encounter (Signed)
Pt calling stating that he needs samples of Xarelto 20 mg tablets, because pt is in the donut hole. I advised that pt that I would leave 2 weeks supply and pt assistance forms at the front desk for pt to pick up and if the pt could please return the forms ASAP to try and get assistance for the pt to get his medication. I advised the pt that if he has any other problems, questions or concerns to please call the office. Pt verbalized understanding.

## 2016-11-20 NOTE — Telephone Encounter (Signed)
Will place [employer] [place] patient assistance program in Dr Karie Schwalbe box for completion. Patient is needing assistance for his XARELTO.  The patient portion of application has been taken up front for him to pick up.

## 2016-11-21 NOTE — Telephone Encounter (Signed)
**Note De-Identified Jeremy Boyle Obfuscation** I did receive the pts out of pocket expense report from his pharmacy. I have faxed his application and all needed documents to [facility] and [place] pt assistance program.

## 2016-11-21 NOTE — Telephone Encounter (Signed)
**Note De-Identified Cheyla Duchemin Obfuscation** The pt returned his [employer] [place] [place] pt assistance application for Xarelto to me this morning. He did not bring in his out of pocket expense report from his pharmacy. I gave our fax number to the pt and asked him to stop by his pharmacy and ask them to fax that report to me.  The pt is aware that I will fax his application to [place] [employer] [place] once I get his out of pocket expense report from his pharmacy.

## 2016-11-27 NOTE — Telephone Encounter (Signed)
We received a fax on 11/26/2016 from [place] [employer] [place] stating they received incomplete application for patients assistance with XARELTO. I have a confirmation fax sheet dated 11/21/2016 that all information requested was faxed by Lyn Via, LPN. I did refax all information with 7 page confirmation.   Message routed to Lyn Via, LPN FYI

## 2016-12-03 ENCOUNTER — Encounter: Payer: Self-pay | Admitting: Internal Medicine

## 2016-12-03 ENCOUNTER — Ambulatory Visit (INDEPENDENT_AMBULATORY_CARE_PROVIDER_SITE_OTHER): Payer: PPO | Admitting: Internal Medicine

## 2016-12-03 VITALS — BP 138/82 | HR 76 | Ht 71.0 in | Wt 216.0 lb

## 2016-12-03 DIAGNOSIS — I480 Paroxysmal atrial fibrillation: Secondary | ICD-10-CM

## 2016-12-03 DIAGNOSIS — Z95810 Presence of automatic (implantable) cardiac defibrillator: Secondary | ICD-10-CM

## 2016-12-03 DIAGNOSIS — I255 Ischemic cardiomyopathy: Secondary | ICD-10-CM | POA: Diagnosis not present

## 2016-12-03 NOTE — Patient Instructions (Signed)

## 2016-12-03 NOTE — Progress Notes (Signed)
HPI [name] returns today for ongoing evaluation and management of chronic systolic heart failure, and ischemic cardiomyopathy, status post ICD insertion. 4 over a year after his surgery, he had severe incisional pain which has improved markedly. He has suddenly developed intermittent elevated pacing impedances in the setting of normal pacing thresholds, normal R waves, and normal high-voltage impedances. He denies chest pain or shortness of breath. He remains active. He is an avid Therapist, nutritionalhunter.  No Known Allergies   Current Outpatient Prescriptions  Medication Sig Dispense Refill  . acetaminophen (TYLENOL) 500 MG tablet Take 500 mg by mouth every 6 (six) hours as needed for moderate pain.    Marland Kitchen. atorvastatin (LIPITOR) 40 MG tablet Take 1 tablet (40 mg total) by mouth daily. 90 tablet 0  . clopidogrel (PLAVIX) 75 MG tablet Take 1 tablet (75 mg total) by mouth daily. 90 tablet 0  . lisinopril (PRINIVIL,ZESTRIL) 10 MG tablet Take 1 tablet (10 mg total) by mouth daily. 90 tablet 3  . metoprolol tartrate (LOPRESSOR) 50 MG tablet Take 1 tablet (50 mg total) by mouth 2 (two) times daily. 180 tablet 0  . nitroGLYCERIN (NITROSTAT) 0.4 MG SL tablet Place 0.4 mg under the tongue every 5 (five) minutes as needed for chest pain (MAX 3 TABLETS).     Marland Kitchen. omeprazole (PRILOSEC) 40 MG capsule Take 1 capsule by mouth daily.    . rivaroxaban (XARELTO) 20 MG TABS tablet TAKE ONE TABLET BY MOUTH EVERY DAY WITH SUPPER 30 tablet 5   No current facility-administered medications for this visit.      Past Medical History:  Diagnosis Date  . Acute myocardial infarction of other anterior wall, episode of care unspecified   . Atrial fibrillation (HCC)   . Coronary atherosclerosis of native coronary artery    diagnosed 12/10   . ICD (implantable cardiac defibrillator) in place   . Ischemic cardiomyopathy   . 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)     ROS:   All systems reviewed and negative  except as noted in the HPI.   Past Surgical History:  Procedure Laterality Date  . BACK SURGERY    . CARDIAC DEFIBRILLATOR PLACEMENT     [employer] Jude  . KNEE SURGERY    . SHOULDER SURGERY    . VASECTOMY       Family History  Problem Relation Age of Onset  . Coronary artery disease Mother        Cabd  . Diabetes Mother   . Heart attack Father        died [id number]     Social History   Social History  . Marital status: Married    Spouse name: N/A  . Number of children: 2  . Years of education: N/A   Occupational History  . Not on file.   Social History Main Topics  . Smoking status: Former Smoker    Quit date: 01/03/2009  . Smokeless tobacco: Never Used  . Alcohol use No  . Drug use: No  . Sexual activity: Not on file   Other Topics Concern  . Not on file   Social History Narrative  . No narrative on file     BP 138/82   Pulse 76   Ht 5\' 11"  (1.803 m)   Wt 216 lb (98 kg)   BMI 30.13 kg/m   Physical Exam:  Well appearing NAD HEENT: Unremarkable Neck:  6 cm  JVD, no thyromegally Lymphatics:  No adenopathy Back:  No CVA tenderness Lungs:  Clear, With no wheezes, rales, or rhonchi. Well-healed ICD incision.  HEART:  Regular rate rhythm, no murmurs, no rubs, no clicks Abd:  soft, positive bowel sounds, no organomegally, no rebound, no guarding Ext:  2 plus pulses, no edema, no cyanosis, no clubbing Skin:  No rashes no nodules Neuro:  CN II through XII intact, motor grossly intact   DEVICE  Normal device function.  See PaceArt for details.   Assess/Plan: 1. Chronic systolic heart failure - his symptoms are class IIA. He will continue his current medications. He is encouraged to maintain a low-sodium diet. 2. Ischemic cardiomyopathy - he denies anginal symptoms. The main question is whether or not he can stop his Plavix. I referred him to Dr. Sanjuana Kava to discuss this. 3. ICD - the patient has approximately 4 years left on his battery. I would anticipate  placing a new lead at the time of his generator change out. We discussed lead extraction as well.  Lewayne Bunting, M.D.

## 2016-12-04 NOTE — Progress Notes (Signed)
Chief Complaint  Patient presents with  . Coronary Artery Disease     History of Present Illness: 61 yo male with history of former tobacco abuse, CAD, ischemic cardiomyopathy, atrial fibrillation who is here today for cardiac follow up. He was admitted to [facility] in December 2010 with an acute anterior STEMI with thrombosis of the mid LAD stent placed in 2005. I placed a drug eluting stent in the mid LAD. After his MI, his hospitalization was complicated by atrial fibrillation. He was also found to have severe hypokinesis of the anterior wall on echo and a possible LV thrombus. He has been anti-coagulated to limit the risk of LV thrombus given the fact that his anterior wall is akinetic. Cardiac MRI in the spring of 2011 with EF of 35% and full thickness scar in the apex, distal septum and inferoapex.He has been seen by Dr. Ladona Ridgelaylor and an ICD was placed in August 2011 given his risk profile for sudden cardiac death. He has been followed in our device clinic by Dr. Ladona Ridgelaylor. No assessment of LV systolic function since 2011 as pt has not wanted to pay for an echo.   He is here today for follow up. The patient denies any chest pain, dyspnea, palpitations, lower extremity edema, orthopnea, PND, dizziness, near syncope or syncope. He has been hunting.   Primary Care Physician: Gordan PaymentGrisso, Greg A., MD  Past Medical History:  Diagnosis Date  . Acute myocardial infarction of other anterior wall, episode of care unspecified   . Atrial fibrillation (HCC)   . Coronary atherosclerosis of native coronary artery    diagnosed 12/10   . ICD (implantable cardiac defibrillator) in place   . Ischemic cardiomyopathy   . LV (left ventricular) mural thrombus following MI [facility](HCC)     Past Surgical History:  Procedure Laterality Date  . BACK SURGERY    . CARDIAC DEFIBRILLATOR PLACEMENT     [employer]  . KNEE SURGERY    . SHOULDER SURGERY    . VASECTOMY      Current Outpatient Prescriptions  Medication Sig  Dispense Refill  . acetaminophen (TYLENOL) 500 MG tablet Take 500 mg by mouth every 6 (six) hours as needed for moderate pain.    Marland Kitchen. atorvastatin (LIPITOR) 40 MG tablet Take 1 tablet (40 mg total) by mouth daily. 90 tablet 0  . clopidogrel (PLAVIX) 75 MG tablet Take 1 tablet (75 mg total) by mouth daily. 90 tablet 0  . lisinopril (PRINIVIL,ZESTRIL) 10 MG tablet Take 1 tablet (10 mg total) by mouth daily. 90 tablet 3  . metoprolol tartrate (LOPRESSOR) 50 MG tablet Take 1 tablet (50 mg total) by mouth 2 (two) times daily. 180 tablet 0  . nitroGLYCERIN (NITROSTAT) 0.4 MG SL tablet Place 0.4 mg under the tongue every 5 (five) minutes as needed for chest pain (MAX 3 TABLETS).     Marland Kitchen. omeprazole (PRILOSEC) 40 MG capsule Take 1 capsule by mouth daily.    . rivaroxaban (XARELTO) 20 MG TABS tablet TAKE ONE TABLET BY MOUTH EVERY DAY WITH SUPPER 30 tablet 5   No current facility-administered medications for this visit.     No Known Allergies  Social History   Social History  . Marital status: Married    Spouse name: N/A  . Number of children: 2  . Years of education: N/A   Occupational History  . Not on file.   Social History Main Topics  . Smoking status: Former Smoker    Quit date:  01/03/2009  . Smokeless tobacco: Never Used  . Alcohol use No  . Drug use: No  . Sexual activity: Not on file   Other Topics Concern  . Not on file   Social History Narrative  . No narrative on file    Family History  Problem Relation Age of Onset  . Coronary artery disease Mother        Cabd  . Diabetes Mother   . Heart attack Father        died 74    Review of Systems:  As stated in the HPI and otherwise negative.   BP 118/70   Pulse 74   Ht 5\' 11"  (1.803 m)   Wt 214 lb 6.4 oz (97.3 kg)   SpO2 97%   BMI 29.90 kg/m   Physical Examination:  General: Well developed, well nourished, NAD  HEENT: OP clear, mucus membranes moist  SKIN: warm, dry. No rashes. Neuro: No focal deficits    Musculoskeletal: Muscle strength 5/5 all ext  Psychiatric: Mood and affect normal  Neck: No JVD, no carotid bruits, no thyromegaly, no lymphadenopathy.  Lungs:Clear bilaterally, no wheezes, rhonci, crackles Cardiovascular: Regular rate and rhythm. No murmurs, gallops or rubs. Abdomen:Soft. Bowel sounds present. Non-tender.  Extremities: No lower extremity edema. Pulses are 2 + in the bilateral DP/PT.  EKG:  EKG is ordered today. The ekg ordered today demonstrates NSR, rate 74 bpm. LAFB.   Recent Labs: No results found for requested labs within last 8760 hours.   Lipid Panel Lipid Panel     Component Value Date/Time   CHOL 166 10/19/2015 0941   TRIG 250 (H) 10/19/2015 0941   HDL 37 (L) 10/19/2015 0941   CHOLHDL 4.5 10/19/2015 0941   VLDL 50 (H) 10/19/2015 0941   LDLCALC 79 10/19/2015 0941   LDLDIRECT 113.4 01/07/2011 1021     Wt Readings from Last 3 Encounters:  12/05/16 214 lb 6.4 oz (97.3 kg)  12/03/16 216 lb (98 kg)  10/19/15 216 lb 3.2 oz (98.1 kg)     Other studies Reviewed: Additional studies/ records that were reviewed today include: . Review of the above records demonstrates:    Assessment and Plan:   1. CAD without angina: He has no chest pain suggestive of angina. He has been on Plavix. He will continue on Xarelto. I arranged a stress test in 2016 but this was not completed. Continue beta blocker and statin.   2. Ischemic Cardiomyopathy: ICD in place and followed by Dr. Ladona Ridgel. He is on a beta blocker and an Ace-inh. He is NYHA class 1. Volume status is normal. Will continue current medical therapy. Last echo 2011. Ordered in 2015 but pt did not complete. He is agreeing to an echo in January 2019. Will arrange today.   3. MURAL THROMBUS, APEX OF LV : He is on Xarelto for prevention of LV apical thrombus given the akinetic apex and risk of thrombus.  Samples given today.   4. HTN: BP is controlled. No changes today.   5. HYPERLIPIDEMIA: Continue statin. LDL  near goal in primary care April 2018.   Current medicines are reviewed at length with the patient today.  The patient does not have concerns regarding medicines.  The following changes have been made:  no change  Labs/ tests ordered today include:   Orders Placed This Encounter  Procedures  . EKG 12-Lead  . ECHOCARDIOGRAM COMPLETE    Disposition:   FU with me in 12  months  Signed,  Verne Carrow, MD 12/05/2016 9:36 AM    [facility] [address] [place], [place], [place]  [address] Phone: [phone]; Fax: [phone]

## 2016-12-05 ENCOUNTER — Encounter: Payer: Self-pay | Admitting: Cardiovascular Disease

## 2016-12-05 ENCOUNTER — Ambulatory Visit (INDEPENDENT_AMBULATORY_CARE_PROVIDER_SITE_OTHER): Payer: PPO | Admitting: Cardiovascular Disease

## 2016-12-05 VITALS — BP 118/70 | HR 74 | Ht 71.0 in | Wt 214.4 lb

## 2016-12-05 DIAGNOSIS — I255 Ischemic cardiomyopathy: Secondary | ICD-10-CM | POA: Diagnosis not present

## 2016-12-05 DIAGNOSIS — I2510 Atherosclerotic heart disease of native coronary artery without angina pectoris: Secondary | ICD-10-CM | POA: Diagnosis not present

## 2016-12-05 DIAGNOSIS — E7800 Pure hypercholesterolemia, unspecified: Secondary | ICD-10-CM | POA: Diagnosis not present

## 2016-12-05 DIAGNOSIS — I10 Essential (primary) hypertension: Secondary | ICD-10-CM

## 2016-12-05 NOTE — Patient Instructions (Signed)
Medication Instructions:  Your physician recommends that you continue on your current medications as directed. Please refer to the Current Medication list given to you today.   Labwork: none  Testing/Procedures: Your physician has requested that you have an echocardiogram. Echocardiography is a painless test that uses sound waves to create images of your heart. It provides your doctor with information about the size and shape of your heart and how well your heart's chambers and valves are working. This procedure takes approximately one hour. There are no restrictions for this procedure.  To be done in January 2019  Follow-Up: Your physician recommends that you schedule a follow-up appointment in:12 months. Please call our office in about 9 months to schedule this appointment    Any Other Special Instructions Will Be Listed Below (If Applicable).     If you need a refill on your cardiac medications before your next appointment, please call your pharmacy.

## 2016-12-08 LAB — CUP PACEART INCLINIC DEVICE CHECK
Battery Remaining Longevity: 48 mo
Brady Statistic RV Percent Paced: 0.01 %
Date Time Interrogation Session: 20181031135342
HighPow Impedance: 56.25 Ohm
Implantable Lead Implant Date: 20110826
Implantable Lead Location: 753860
Implantable Lead Model: 7121
Lead Channel Pacing Threshold Pulse Width: 0.5 ms
Lead Channel Setting Pacing Amplitude: 2.5 V
MDC IDC MSMT LEADCHNL RV IMPEDANCE VALUE: 1950 Ohm
MDC IDC MSMT LEADCHNL RV PACING THRESHOLD AMPLITUDE: 1 V
MDC IDC MSMT LEADCHNL RV SENSING INTR AMPL: 11.5 mV
MDC IDC PG IMPLANT DT: 20110826
MDC IDC SET LEADCHNL RV PACING PULSEWIDTH: 0.5 ms
MDC IDC SET LEADCHNL RV SENSING SENSITIVITY: 0.5 mV
Pulse Gen Serial Number: 777665

## 2016-12-08 NOTE — Telephone Encounter (Signed)
I have given the pt the phone number to reach [place] [employer] [place] at [phone] to check the progress of his pt assistance application. He verbalized understanding and thanked me for my help.

## 2016-12-11 ENCOUNTER — Telehealth: Payer: Self-pay | Admitting: *Deleted

## 2016-12-11 NOTE — Telephone Encounter (Signed)
Called tech svcs, per Gypsy Balsam, NP recommendation, about v.noise reversions seen on remote. Tech svcs stated that they were able to increase the gain on their end, and saw that the patient has been having episodes of EMI recorded by the device. I asked the representative to fax a copy of the magnified image to the DC fax machine. Fax number given.  Called patient to see if he had been around any new equipment during the times of the noise reversion episodes. Patient denies being around anything new. He states that he was relaxing at home during the most recent recording from 11/7. I informed patient that I would review the episodes with Dr.Taylor and call patient if anything further was recommended. Patient verbalized understanding.

## 2016-12-16 NOTE — Telephone Encounter (Signed)
Spoke to Jeremy Boyle, STJ who also discussed patient's RV noise reversions with tech support. The representative he spoke with said that the episodes were being recorded during the automatic impedance checks. He said that the reversions are likely d/t this and not d/t any issues with the lead itself. Judie Grieve Small said that he will discuss this further with Dr.Taylor, but he does not feel that patient needs any immediate intervention.

## 2016-12-23 ENCOUNTER — Telehealth: Payer: Self-pay | Admitting: *Deleted

## 2016-12-23 NOTE — Telephone Encounter (Signed)
Patient called and stated that he has applied for patient assistance for xarelto but he has not heard anything yet. He is aware that he needs to call the company to follow up on this. He states that he has called them but always is placed on hold for more than thirty minutes and never actually speaks to a representative. He is requesting samples. He is aware that I will place one bottle at the front desk and I have reiterated that he needs to follow up on the application status. Patient verbalized his understanding and appreciation.

## 2016-12-30 NOTE — Telephone Encounter (Signed)
I called [employer] pt assistance and s/w Scheryl Marten concerning the pts application that was faxed in on 11/21/16. Per Scheryl Marten they need a copy of the pts insurance card in order to make a determination. I have faxed a copy (front and back) of the pts insurance card to [place] and Jeremy Boyle ATTN: Scheryl Marten  The pt is aware.

## 2017-01-01 ENCOUNTER — Ambulatory Visit (INDEPENDENT_AMBULATORY_CARE_PROVIDER_SITE_OTHER): Payer: PPO | Admitting: *Deleted

## 2017-01-01 ENCOUNTER — Other Ambulatory Visit: Payer: Self-pay | Admitting: Internal Medicine

## 2017-01-01 DIAGNOSIS — I255 Ischemic cardiomyopathy: Secondary | ICD-10-CM | POA: Diagnosis not present

## 2017-01-01 NOTE — Progress Notes (Signed)
Remote ICD transmission.   

## 2017-01-02 ENCOUNTER — Encounter: Payer: Self-pay | Admitting: Cardiology

## 2017-01-02 NOTE — Telephone Encounter (Signed)
**Note De-Identified Jeremy Boyle Obfuscation** The pt called and requested Xarelto samples as he has not heard from [place] [employer] [place] pt assistance concerning his application. I have advised him that I will leave 2 bottles of Xarelto samples in our front office for him to pick up. He verbalized understanding and thanked me for helping him.

## 2017-01-13 LAB — CUP PACEART REMOTE DEVICE CHECK
Battery Remaining Longevity: 47 mo
Battery Remaining Percentage: 40 %
Battery Voltage: 2.9 V
Date Time Interrogation Session: 20181129081621
HighPow Impedance: 52 Ohm
Implantable Lead Location: 753860
Implantable Lead Model: 7121
Implantable Pulse Generator Implant Date: 20110826
Lead Channel Sensing Intrinsic Amplitude: 11.5 mV
Lead Channel Setting Pacing Amplitude: 2.5 V
Lead Channel Setting Pacing Pulse Width: 0.5 ms
Lead Channel Setting Sensing Sensitivity: 0.5 mV
MDC IDC LEAD IMPLANT DT: 20110826
MDC IDC MSMT LEADCHNL RV IMPEDANCE VALUE: 530 Ohm
MDC IDC MSMT LEADCHNL RV PACING THRESHOLD AMPLITUDE: 1 V
MDC IDC MSMT LEADCHNL RV PACING THRESHOLD PULSEWIDTH: 0.5 ms
MDC IDC PG SERIAL: 777665
MDC IDC STAT BRADY RV PERCENT PACED: 1 %

## 2017-01-15 ENCOUNTER — Telehealth: Payer: Self-pay | Admitting: *Deleted

## 2017-01-15 NOTE — Telephone Encounter (Signed)
Patient still hasnt heard anything from [facility] and [employer] for patient assistance with Jeremy Boyle, we have been working on this since Oct, 2018. I called [employer] and they needed 1040 from 2018 or a note stating he doesn't file. I called patient and he states he's frustrated with the wait and is just going to wait until insurance kicks back in Jan. 2019. He does appreciate our help.

## 2017-02-10 ENCOUNTER — Ambulatory Visit (HOSPITAL_COMMUNITY): Payer: PPO | Attending: Cardiology

## 2017-02-10 ENCOUNTER — Other Ambulatory Visit: Payer: Self-pay

## 2017-02-10 DIAGNOSIS — R29898 Other symptoms and signs involving the musculoskeletal system: Secondary | ICD-10-CM | POA: Insufficient documentation

## 2017-02-10 DIAGNOSIS — Z8249 Family history of ischemic heart disease and other diseases of the circulatory system: Secondary | ICD-10-CM | POA: Diagnosis not present

## 2017-02-10 DIAGNOSIS — I252 Old myocardial infarction: Secondary | ICD-10-CM | POA: Diagnosis not present

## 2017-02-10 DIAGNOSIS — I255 Ischemic cardiomyopathy: Secondary | ICD-10-CM | POA: Insufficient documentation

## 2017-02-10 DIAGNOSIS — I2510 Atherosclerotic heart disease of native coronary artery without angina pectoris: Secondary | ICD-10-CM | POA: Insufficient documentation

## 2017-02-10 DIAGNOSIS — Z87891 Personal history of nicotine dependence: Secondary | ICD-10-CM | POA: Insufficient documentation

## 2017-02-10 DIAGNOSIS — Z95810 Presence of automatic (implantable) cardiac defibrillator: Secondary | ICD-10-CM | POA: Insufficient documentation

## 2017-02-10 DIAGNOSIS — I517 Cardiomegaly: Secondary | ICD-10-CM | POA: Insufficient documentation

## 2017-02-10 MED ORDER — PERFLUTREN LIPID MICROSPHERE
1.0000 mL | INTRAVENOUS | Status: AC | PRN
  Administered 2017-02-10 (×2): 2 mL via INTRAVENOUS

## 2017-02-12 ENCOUNTER — Other Ambulatory Visit: Payer: Self-pay | Admitting: Cardiovascular Disease

## 2017-03-02 ENCOUNTER — Other Ambulatory Visit: Payer: Self-pay | Admitting: Cardiovascular Disease

## 2017-04-02 ENCOUNTER — Ambulatory Visit (INDEPENDENT_AMBULATORY_CARE_PROVIDER_SITE_OTHER): Payer: PPO | Admitting: *Deleted

## 2017-04-02 DIAGNOSIS — I255 Ischemic cardiomyopathy: Secondary | ICD-10-CM

## 2017-04-02 NOTE — Progress Notes (Signed)
Remote ICD transmission.   

## 2017-04-03 ENCOUNTER — Encounter: Payer: Self-pay | Admitting: Cardiology

## 2017-04-21 LAB — CUP PACEART REMOTE DEVICE CHECK
Battery Remaining Longevity: 47 mo
Battery Remaining Percentage: 38 %
Battery Voltage: 2.89 V
HIGH POWER IMPEDANCE MEASURED VALUE: 56 Ohm
Implantable Lead Model: 7121
Implantable Pulse Generator Implant Date: 20110826
Lead Channel Setting Pacing Amplitude: 2.5 V
Lead Channel Setting Pacing Pulse Width: 0.5 ms
Lead Channel Setting Sensing Sensitivity: 0.5 mV
MDC IDC LEAD IMPLANT DT: 20110826
MDC IDC LEAD LOCATION: 753860
MDC IDC MSMT LEADCHNL RV IMPEDANCE VALUE: 1625 Ohm
MDC IDC MSMT LEADCHNL RV PACING THRESHOLD AMPLITUDE: 1 V
MDC IDC MSMT LEADCHNL RV PACING THRESHOLD PULSEWIDTH: 0.5 ms
MDC IDC MSMT LEADCHNL RV SENSING INTR AMPL: 10.5 mV
MDC IDC SESS DTM: 20190226184315
MDC IDC STAT BRADY RV PERCENT PACED: 1 %
Pulse Gen Serial Number: 777665

## 2017-07-02 ENCOUNTER — Ambulatory Visit (INDEPENDENT_AMBULATORY_CARE_PROVIDER_SITE_OTHER): Payer: PPO | Admitting: *Deleted

## 2017-07-02 DIAGNOSIS — I255 Ischemic cardiomyopathy: Secondary | ICD-10-CM | POA: Diagnosis not present

## 2017-07-02 NOTE — Progress Notes (Signed)
Remote ICD transmission.   

## 2017-07-06 ENCOUNTER — Encounter: Payer: Self-pay | Admitting: Cardiology

## 2017-07-22 LAB — CUP PACEART REMOTE DEVICE CHECK
Battery Remaining Longevity: 44 mo
Brady Statistic RV Percent Paced: 1 %
Date Time Interrogation Session: 20190530071229
HighPow Impedance: 53 Ohm
Implantable Lead Location: 753860
Implantable Lead Model: 7121
Lead Channel Pacing Threshold Pulse Width: 0.5 ms
Lead Channel Setting Pacing Pulse Width: 0.5 ms
MDC IDC LEAD IMPLANT DT: 20110826
MDC IDC MSMT BATTERY REMAINING PERCENTAGE: 36 %
MDC IDC MSMT BATTERY VOLTAGE: 2.89 V
MDC IDC MSMT LEADCHNL RV IMPEDANCE VALUE: 1925 Ohm
MDC IDC MSMT LEADCHNL RV PACING THRESHOLD AMPLITUDE: 1 V
MDC IDC MSMT LEADCHNL RV SENSING INTR AMPL: 11.5 mV
MDC IDC PG IMPLANT DT: 20110826
MDC IDC PG SERIAL: 777665
MDC IDC SET LEADCHNL RV PACING AMPLITUDE: 2.5 V
MDC IDC SET LEADCHNL RV SENSING SENSITIVITY: 0.5 mV

## 2017-09-30 ENCOUNTER — Telehealth: Payer: Self-pay | Admitting: Cardiovascular Disease

## 2017-09-30 NOTE — Telephone Encounter (Signed)
New Message         Patient would like a call concerning his 12/24/2017 appointment. Pls advise

## 2017-09-30 NOTE — Telephone Encounter (Signed)
**Note De-Identified Jeremy Boyle Obfuscation** The pt states that he is in the donut hole and cannot afford $400 a month for Xarelto. I encouraged him to apply for pt assistance through the manufacturer but he states that he has tried that for the past several years and they "blow him off" so he does not want to re-apply.  According to his chart he applied last year but Jeremy Boyle and Jeremy Boyle needed his proof of income which the pt states that he provided.  Once a pt applies for pt asst the company only communicates with the providers office if they need something (information) from the provider otherwise they reach out to the pt by mail or phone if they need more info from the pt as it seems may have happened last year but Im unsure as the manufacturer does not even let the office know if the pt was approved or denied in most cases.  He is requesting a less expensive medication as he does not want to apply for pt assistance again.  He is aware that I am sending this message to Dr Clifton James and his nurse for advisement to the pt.

## 2017-09-30 NOTE — Telephone Encounter (Signed)
Spoke with patient, he has an appointment with Dr. Clifton James, he does not have any recent labs and he asked if he going to need fasting lab work. I am sending to Dr. Gibson Ramp nurse for recommendations.   He also dicussed that he is in the donut hole for Xarelto and cannot afford the medication. He requested assistance or samples. Sending to [facility] Via for recommendations.

## 2017-10-01 ENCOUNTER — Ambulatory Visit (INDEPENDENT_AMBULATORY_CARE_PROVIDER_SITE_OTHER): Payer: PPO | Admitting: *Deleted

## 2017-10-01 DIAGNOSIS — I255 Ischemic cardiomyopathy: Secondary | ICD-10-CM | POA: Diagnosis not present

## 2017-10-01 DIAGNOSIS — I10 Essential (primary) hypertension: Secondary | ICD-10-CM

## 2017-10-01 NOTE — Telephone Encounter (Signed)
Pat, Can we see if Xarelto samples would help? If we do not have enough samples to help out, we could change him to coumadin. Thanks, chris

## 2017-10-01 NOTE — Telephone Encounter (Signed)
I spoke with pt.  He has not had lab work checked at primary care this year.  He will eat a light breakfast the day he comes in to see Dr. Clifton James in November and have lab work done at that appointment. Pt does not want to go back on coumadin.  I explained to pt that we could not give him samples of Xarelto through the end of the year.  I told him we can usually give samples of Xarelto while patients are waiting to hear if approved for assistance.  Pt initially did not want to apply for assistance as he has been denied in the past but he is now willing to try this.  He has a month left of Xarelto. I told pt I would forward to Lyn and she would contact him about starting assistance process.

## 2017-10-01 NOTE — Progress Notes (Signed)
Remote ICD transmission.   

## 2017-10-02 ENCOUNTER — Encounter: Payer: Self-pay | Admitting: Cardiology

## 2017-10-06 NOTE — Telephone Encounter (Signed)
**Note De-Identified Tanyla Stege Obfuscation** No answer so I left a message on the pts VM advising him that I am mailing him a [employer] and [place] pt asst application through the mail.

## 2017-10-13 NOTE — Telephone Encounter (Signed)
The pt returned his completed [employer] pt asst application for Xarelto and all needed documents. I have placed in Dr Gibson Ramp mail bin awaiting his signature.

## 2017-10-20 ENCOUNTER — Other Ambulatory Visit: Payer: Self-pay | Admitting: Cardiovascular Disease

## 2017-10-21 NOTE — Telephone Encounter (Signed)
Pt last saw Dr Clifton James 12/05/16 has upcoming appt scheduled for 12/24/17.  Last labs 04/30/16 Cr 0.76, age 62, weight 97.3kg, CrCl 138.7.  Pt is past due for labwork, placed note on upcoming appt with Dr Clifton James to draw CBC and BMP at [facility].  Pt is on appropriate dosage of Xarelto 20mg  QD according to last labs.  Will refill only enough to get to appt and then will access appropriateness of dosage at that time.

## 2017-10-26 NOTE — Telephone Encounter (Signed)
Dr Clifton James has signed the pt assistance application for Xarelto and I have faxed it to [facility] and [employer].

## 2017-10-29 ENCOUNTER — Other Ambulatory Visit: Payer: Self-pay | Admitting: Cardiovascular Disease

## 2017-11-02 LAB — CUP PACEART REMOTE DEVICE CHECK
Battery Remaining Longevity: 41 mo
Brady Statistic RV Percent Paced: 1 %
Date Time Interrogation Session: 20190829073837
HighPow Impedance: 57 Ohm
Implantable Lead Implant Date: 20110826
Implantable Lead Location: 753860
Implantable Pulse Generator Implant Date: 20110826
Lead Channel Impedance Value: 590 Ohm
Lead Channel Pacing Threshold Amplitude: 1 V
Lead Channel Pacing Threshold Pulse Width: 0.5 ms
Lead Channel Sensing Intrinsic Amplitude: 11.5 mV
Lead Channel Setting Pacing Pulse Width: 0.5 ms
MDC IDC MSMT BATTERY REMAINING PERCENTAGE: 35 %
MDC IDC MSMT BATTERY VOLTAGE: 2.89 V
MDC IDC SET LEADCHNL RV PACING AMPLITUDE: 2.5 V
MDC IDC SET LEADCHNL RV SENSING SENSITIVITY: 0.5 mV
Pulse Gen Serial Number: 777665

## 2017-11-09 ENCOUNTER — Telehealth: Payer: Self-pay | Admitting: Cardiovascular Disease

## 2017-11-09 NOTE — Telephone Encounter (Signed)
Pt called stating that he called [employer] and [employer] patient assistance program and they stated that pt needed some additional information. I explained to the pt that Elease Hashimoto Via, LPN, is out of the office until 11/11/17 and that I would leave a bottle of Xarelto samples at the front desk for pt to pick up until Elease Hashimoto Via, LPN has chance to address this matter. Pt verbalized understanding.

## 2017-11-11 NOTE — Telephone Encounter (Signed)
**Note De-Identified Elira Colasanti Obfuscation** I called [employer] and [employer] and s/w [place] who advised me that they did not receive the pts 2018 proof of income.  I checked the pts application and his 2018 proof of income was included but must have been missed when faxing.  I have re-faxed the pts 2018 proof of income to [place] and [place].

## 2017-11-13 NOTE — Telephone Encounter (Addendum)
The pt called the office this morning requesting that I re-fax his 2019 out of pocket expense report to [employer] as he states that he s/w someone there yesterday who advised him that they did not receive it with his pt asst application.  I have re-faxed the OOPE report to [employer] and did receive a confirmation that the fax was successful.  Also, I called [employer] and [employer] and [employer]. Per Noli they have received all they need to make a determination (he did verify that they received the OOPE report that I faxed them this morning).  I have left 2 bottles of Xarelto 20 mg samples in the front office for the pt to pick up as he states that he will run out soon.

## 2017-11-23 NOTE — Telephone Encounter (Addendum)
**Note De-Identified Heidi Lemay Obfuscation** Letter received from [facility] and [employer] stating that they have denied pt asst for Xarelto.  Reason: Per Mindy at [place] pt asst foundation the pt has not met his 4% out of pocket expense at this time so they have denied this request.  The letter also states that they have notified the pt of this decision.

## 2017-12-24 ENCOUNTER — Encounter: Payer: Self-pay | Admitting: Cardiovascular Disease

## 2017-12-24 ENCOUNTER — Ambulatory Visit: Payer: PPO | Admitting: Cardiovascular Disease

## 2017-12-24 VITALS — BP 126/78 | HR 60 | Ht 71.0 in | Wt 215.0 lb

## 2017-12-24 DIAGNOSIS — I2129 ST elevation (STEMI) myocardial infarction involving other sites: Secondary | ICD-10-CM

## 2017-12-24 DIAGNOSIS — I2510 Atherosclerotic heart disease of native coronary artery without angina pectoris: Secondary | ICD-10-CM

## 2017-12-24 DIAGNOSIS — I10 Essential (primary) hypertension: Secondary | ICD-10-CM

## 2017-12-24 DIAGNOSIS — E7800 Pure hypercholesterolemia, unspecified: Secondary | ICD-10-CM

## 2017-12-24 DIAGNOSIS — I255 Ischemic cardiomyopathy: Secondary | ICD-10-CM

## 2017-12-24 MED ORDER — ASPIRIN EC 81 MG PO TBEC: 81.0000 mg | DELAYED_RELEASE_TABLET | Freq: Every day | ORAL | 3 refills | Status: AC

## 2017-12-24 NOTE — Patient Instructions (Signed)
Medication Instructions:  Your physician has recommended you make the following change in your medication:  Stop Plavix (clopidogrel) Start aspirin 81 mg by mouth daily.   If you need a refill on your cardiac medications before your next appointment, please call your pharmacy.   Lab work: Lab work to be done today--CMET, Lipid profile, CBC If you have labs (blood work) drawn today and your tests are completely normal, you will receive your results only by: Marland Kitchen MyChart Message (if you have MyChart) OR . A paper copy in the mail If you have any lab test that is abnormal or we need to change your treatment, we will call you to review the results.  Testing/Procedures: none  Follow-Up: At [facility], you and your health needs are our priority.  As part of our continuing mission to provide you with exceptional heart care, we have created designated Provider Care Teams.  These Care Teams include your primary Cardiologist (physician) and Advanced Practice Providers (APPs -  Physician Assistants and Nurse Practitioners) who all work together to provide you with the care you need, when you need it. You will need a follow up appointment in 12 months.  Please call our office 2 months in advance to schedule this appointment.  You may see Verne Carrow, MD or one of the following Advanced Practice Providers on your designated Care Team:   [place], PA-C Ronie Spies, PA-C . Jacolyn Reedy, PA-C  Any Other Special Instructions Will Be Listed Below (If Applicable).

## 2017-12-24 NOTE — Progress Notes (Signed)
Chief Complaint  Patient presents with  . Follow-up    CAD   History of Present Illness: 62 yo male with history of former tobacco abuse, CAD, ischemic cardiomyopathy, atrial fibrillation who is here today for cardiac follow up. He was admitted to [facility] in December 2010 with an anterior ST elevation MI with thrombosis of the mid LAD stent placed in 2005. I placed a drug eluting stent in the mid LAD. After his MI, his hospitalization was complicated by atrial fibrillation. He was also found to have severe hypokinesis of the anterior wall on echo and a possible LV thrombus. He has been anti-coagulated to limit the risk of LV thrombus given the fact that his anterior wall is akinetic. Cardiac MRI in the spring of 2011 with EF of 35% and full thickness scar in the apex, distal septum and inferoapex.He has been seen by Dr. Lovena Le and an ICD was placed in August 2011 given his risk profile for sudden cardiac death. He has been followed in our device clinic by Dr. Lovena Le. Echo January 2019 with LVEF=45-50%. Contrast in apex. Akinesis of the apical lateral, septal, inferolateral and inferior myocardium. Grade 1 diastolic dysfunction.   She is here today for follow up. The patient denies any chest pain, dyspnea, palpitations, lower extremity edema, orthopnea, PND, dizziness, near syncope or syncope. He feels great.   Primary Care Physician: Raina Mina., MD  Past Medical History:  Diagnosis Date  . Acute myocardial infarction of other anterior wall, episode of care unspecified   . Atrial fibrillation ([place])   . Coronary atherosclerosis of native coronary artery    diagnosed 12/10   . ICD (implantable cardiac defibrillator) in place   . Ischemic cardiomyopathy   . LV (left ventricular) mural thrombus following MI [facility])     Past Surgical History:  Procedure Laterality Date  . BACK SURGERY    . CARDIAC DEFIBRILLATOR PLACEMENT     St Jude  . KNEE SURGERY    . SHOULDER SURGERY    . VASECTOMY       Current Outpatient Medications  Medication Sig Dispense Refill  . acetaminophen (TYLENOL) 500 MG tablet Take 500 mg by mouth every 6 (six) hours as needed for moderate pain.    Marland Kitchen atorvastatin (LIPITOR) 40 MG tablet TAKE 1 TABLET ONCE DAILY 90 tablet 3  . famotidine (PEPCID) 20 MG tablet Take 20 mg by mouth daily.    Marland Kitchen lisinopril (PRINIVIL,ZESTRIL) 10 MG tablet TAKE 1 TABLET ONCE DAILY 90 tablet 0  . metoprolol tartrate (LOPRESSOR) 50 MG tablet TAKE ONE TABLET BY MOUTH 2 TIMES A DAY 180 tablet 0  . nitroGLYCERIN (NITROSTAT) 0.4 MG SL tablet Place 0.4 mg under the tongue every 5 (five) minutes as needed for chest pain (MAX 3 TABLETS).     Marland Kitchen omeprazole (PRILOSEC) 40 MG capsule Take 1 capsule by mouth daily.    Alveda Reasons 20 MG TABS tablet TAKE 1 TABLET ONCE DAILY WITH SUPPER 30 tablet 2  . aspirin EC 81 MG tablet Take 1 tablet (81 mg total) by mouth daily. 90 tablet 3   No current facility-administered medications for this visit.     No Known Allergies  Social History   Socioeconomic History  . Marital status: Married    Spouse name: Not on file  . Number of children: 2  . Years of education: Not on file  . Highest education level: Not on file  Occupational History  . Not on file  Social Needs  . Financial resource strain: Not on file  . Food insecurity:    Worry: Not on file    Inability: Not on file  . Transportation needs:    Medical: Not on file    Non-medical: Not on file  Tobacco Use  . Smoking status: Former Smoker    Last attempt to quit: 01/03/2009    Years since quitting: 8.9  . Smokeless tobacco: Never Used  Substance and Sexual Activity  . Alcohol use: No  . Drug use: No  . Sexual activity: Not on file  Lifestyle  . Physical activity:    Days per week: Not on file    Minutes per session: Not on file  . Stress: Not on file  Relationships  . Social connections:    Talks on phone: Not on file    Gets together: Not on file    Attends religious service:  Not on file    Active member of club or organization: Not on file    Attends meetings of clubs or organizations: Not on file    Relationship status: Not on file  . Intimate partner violence:    Fear of current or ex partner: Not on file    Emotionally abused: Not on file    Physically abused: Not on file    Forced sexual activity: Not on file  Other Topics Concern  . Not on file  Social History Narrative  . Not on file    Family History  Problem Relation Age of Onset  . Coronary artery disease Mother        Cabd  . Diabetes Mother   . Heart attack Father        died 37    Review of Systems:  As stated in the HPI and otherwise negative.   BP 126/78   Pulse 60   Ht _0  (1.803 m)   Wt 215 lb (97.5 kg)   SpO2 97%   BMI 29.99 kg/m   Physical Examination: General: Well developed, well nourished, NAD  HEENT: OP clear, mucus membranes moist  SKIN: warm, dry. No rashes. Neuro: No focal deficits  Musculoskeletal: Muscle strength 5/5 all ext  Psychiatric: Mood and affect normal  Neck: No JVD, no carotid bruits, no thyromegaly, no lymphadenopathy.  Lungs:Clear bilaterally, no wheezes, rhonci, crackles Cardiovascular: Regular rate and rhythm. No murmurs, gallops or rubs. Abdomen:Soft. Bowel sounds present. Non-tender.  Extremities: No lower extremity edema. Pulses are 2 + in the bilateral DP/PT.  Echo January 2019: - Left ventricle: The inferoapical region of the LV is aneursymal   with significant swirling of definity contrast in the apex which   is consistent with sluggish blood flow and cannot rule out early   forming thrombus. The cavity size was normal. Systolic function   was mildly reduced. The estimated ejection fraction was in the   range of 45% to 50%. There is akinesis of the apical myocardium.   There is akinesis of the apical lateral, septal, inferolateral,   and inferior myocardium. There is severe hypokinesis of the   apicalanterior myocardium. There was  an increased relative   contribution of atrial contraction to ventricular filling.   Doppler parameters are consistent with abnormal left ventricular   relaxation (grade 1 diastolic dysfunction). - Left atrium: The atrium was mildly dilated.   EKG:  EKG is ordered today. The ekg ordered today demonstrates NSR, rate 60 bpm. Incomplete RBBB. LAFB. Poor R wave progression.   Recent  Labs: No results found for requested labs within last 8760 hours.   Lipid Panel Lipid Panel     Component Value Date/Time   CHOL 166 10/19/2015 0941   TRIG 250 (H) 10/19/2015 0941   HDL 37 (L) 10/19/2015 0941   CHOLHDL 4.5 10/19/2015 0941   VLDL 50 (H) 10/19/2015 0941   LDLCALC 79 10/19/2015 0941   LDLDIRECT 113.4 01/07/2011 1021     Wt Readings from Last 3 Encounters:  12/24/17 215 lb (97.5 kg)  12/05/16 214 lb 6.4 oz (97.3 kg)  12/03/16 216 lb (98 kg)     Other studies Reviewed: Additional studies/ records that were reviewed today include: . Review of the above records demonstrates:    Assessment and Plan:   1. CAD without angina: No chest pain. Will stop Plavix and start ASA 81 mg daily. Continue statin and beta blocker. He is also on Xarelto.  2. Ischemic Cardiomyopathy: ICD in place and followed by Dr. Lovena Le. He is on a beta blocker and an Ace-inh. No symptoms. He is NYHA class 1. LVEF=45-50% by echo January 2019 with apical akinesis.    3. MURAL THROMBUS, APEX OF LV : Sluggish flow seen in apex on echo January 2019. Will continue Xarelto for prevention of LV thrombus given apical akinesis.  Will check CBC and BMET now since he is on Xarelto.   4. HTN: BP is well controlled. No changes  5. HYPERLIPIDEMIA:  Will continue statin. Check lipids/LFT's now.   Current medicines are reviewed at length with the patient today.  The patient does not have concerns regarding medicines.  The following changes have been made:  no change  Labs/ tests ordered today include:   Orders Placed This  Encounter  Procedures  . Comp Met (CMET)  . Lipid Profile  . CBC    Disposition:   FU with me in 12  months  Signed, Lauree Chandler, MD 12/24/2017 2:58 PM    [address], [place], [place]  [address] Phone: [phone]; Fax: [phone]

## 2017-12-25 ENCOUNTER — Telehealth: Payer: Self-pay | Admitting: *Deleted

## 2017-12-25 ENCOUNTER — Encounter: Payer: Self-pay | Admitting: Internal Medicine

## 2017-12-25 ENCOUNTER — Ambulatory Visit: Payer: PPO | Admitting: Internal Medicine

## 2017-12-25 VITALS — BP 132/82 | HR 87 | Ht 71.0 in | Wt 216.0 lb

## 2017-12-25 DIAGNOSIS — Z95810 Presence of automatic (implantable) cardiac defibrillator: Secondary | ICD-10-CM

## 2017-12-25 DIAGNOSIS — I255 Ischemic cardiomyopathy: Secondary | ICD-10-CM | POA: Diagnosis not present

## 2017-12-25 DIAGNOSIS — E7800 Pure hypercholesterolemia, unspecified: Secondary | ICD-10-CM

## 2017-12-25 LAB — CBC
Hematocrit: 48.3 % (ref 37.5–51.0)
Hemoglobin: 16.3 g/dL (ref 13.0–17.7)
MCH: 30.2 pg (ref 26.6–33.0)
MCHC: 33.7 g/dL (ref 31.5–35.7)
MCV: 90 fL (ref 79–97)
Platelets: 274 10*3/uL (ref 150–450)
RBC: 5.39 x10E6/uL (ref 4.14–5.80)
RDW: 12.5 % (ref 12.3–15.4)
WBC: 7.6 10*3/uL (ref 3.4–10.8)

## 2017-12-25 LAB — LIPID PANEL
CHOL/HDL RATIO: 4.2 ratio (ref 0.0–5.0)
Cholesterol, Total: 157 mg/dL (ref 100–199)
HDL: 37 mg/dL — AB (ref >39)
LDL CALC: 90 mg/dL (ref 0–99)
TRIGLYCERIDES: 148 mg/dL (ref 0–149)
VLDL CHOLESTEROL CAL: 30 mg/dL (ref 5–40)

## 2017-12-25 LAB — COMPREHENSIVE METABOLIC PANEL
ALT: 52 IU/L — AB (ref 0–44)
AST: 32 IU/L (ref 0–40)
Albumin/Globulin Ratio: 2.3 — ABNORMAL HIGH (ref 1.2–2.2)
Albumin: 5 g/dL — ABNORMAL HIGH (ref 3.6–4.8)
Alkaline Phosphatase: 81 IU/L (ref 39–117)
BUN/Creatinine Ratio: 15 (ref 10–24)
BUN: 11 mg/dL (ref 8–27)
Bilirubin Total: 0.5 mg/dL (ref 0.0–1.2)
CALCIUM: 9.9 mg/dL (ref 8.6–10.2)
CO2: 22 mmol/L (ref 20–29)
CREATININE: 0.72 mg/dL — AB (ref 0.76–1.27)
Chloride: 102 mmol/L (ref 96–106)
GFR calc Af Amer: 116 mL/min/{1.73_m2} (ref >59)
GFR, EST NON AFRICAN AMERICAN: 100 mL/min/{1.73_m2} (ref >59)
GLOBULIN, TOTAL: 2.2 g/dL (ref 1.5–4.5)
GLUCOSE: 80 mg/dL (ref 65–99)
Potassium: 4.4 mmol/L (ref 3.5–5.2)
SODIUM: 142 mmol/L (ref 134–144)
Total Protein: 7.2 g/dL (ref 6.0–8.5)

## 2017-12-25 NOTE — Telephone Encounter (Signed)
Pt notified. He would like to make dietary changes.  He will come in for follow up lipid profile on 03/30/18

## 2017-12-25 NOTE — Telephone Encounter (Signed)
-----   Message from Kathleene Hazel, MD sent at 12/25/2017 11:15 AM EST ----- LDL is 90. Goal is 70. I would either recommend dietary changes or increase Lipitor to 80 mg daily. If we increase his Lipitor, would need repeat lipids and LFTs in 12 weeks. cdm

## 2017-12-25 NOTE — Patient Instructions (Signed)

## 2017-12-25 NOTE — Progress Notes (Signed)
HPI: [name] returns today for ongoing evaluation and management of chronic systolic heart failure, and ischemic cardiomyopathy, status post ICD insertion. He has suddenly developed intermittent elevated pacing impedances in the setting of normal pacing thresholds, normal R waves, and normal high-voltage impedances. He denies chest pain or shortness of breath. He remains active. He is an avid Therapist, nutritional. He has not had any ICD therapies.  No Known Allergies   Current Outpatient Medications  Medication Sig Dispense Refill  . acetaminophen (TYLENOL) 500 MG tablet Take 500 mg by mouth every 6 (six) hours as needed for moderate pain.    Marland Kitchen aspirin EC 81 MG tablet Take 1 tablet (81 mg total) by mouth daily. 90 tablet 3  . atorvastatin (LIPITOR) 40 MG tablet TAKE 1 TABLET ONCE DAILY 90 tablet 3  . famotidine (PEPCID) 20 MG tablet Take 20 mg by mouth daily.    Marland Kitchen lisinopril (PRINIVIL,ZESTRIL) 10 MG tablet TAKE 1 TABLET ONCE DAILY 90 tablet 0  . metoprolol tartrate (LOPRESSOR) 50 MG tablet TAKE ONE TABLET BY MOUTH 2 TIMES A DAY 180 tablet 0  . nitroGLYCERIN (NITROSTAT) 0.4 MG SL tablet Place 0.4 mg under the tongue every 5 (five) minutes as needed for chest pain (MAX 3 TABLETS).     Marland Kitchen omeprazole (PRILOSEC) 40 MG capsule Take 1 capsule by mouth daily.    Carlena Hurl 20 MG TABS tablet TAKE 1 TABLET ONCE DAILY WITH SUPPER 30 tablet 2   No current facility-administered medications for this visit.      Past Medical History:  Diagnosis Date  . Acute myocardial infarction of other anterior wall, episode of care unspecified   . Atrial fibrillation ([facility])   . Coronary atherosclerosis of native coronary artery    diagnosed 12/10   . ICD (implantable cardiac defibrillator) in place   . Ischemic cardiomyopathy   . 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   Past Surgical History:  Procedure Laterality Date    . BACK SURGERY    . CARDIAC DEFIBRILLATOR PLACEMENT     St Jude  . KNEE SURGERY    . SHOULDER SURGERY    . VASECTOMY       Family History  Problem Relation Age of Onset  . Coronary artery disease Mother        Cabd  . Diabetes Mother   . Heart attack Father        died 90     Social History   Socioeconomic History  . Marital status: Married    Spouse name: Not on file  . Number of children: 2  . Years of education: Not on file  . Highest education level: Not on file  Occupational History  . Not on file  Social Needs  . Financial resource strain: Not on file  . Food insecurity:    Worry: Not on file    Inability: Not on file  . Transportation needs:    Medical: Not on file    Non-medical: Not on file  Tobacco Use  . Smoking status: Former Smoker    Last attempt to quit: 01/03/2009    Years since quitting: 8.9  . Smokeless tobacco: Never Used  Substance and Sexual Activity  . Alcohol use: No  . Drug use: No  . Sexual activity: Not on file  Lifestyle  . Physical activity:  Days per week: Not on file    Minutes per session: Not on file  . Stress: Not on file  Relationships  . Social connections:    Talks on phone: Not on file    Gets together: Not on file    Attends religious service: Not on file    Active member of club or organization: Not on file    Attends meetings of clubs or organizations: Not on file    Relationship status: Not on file  . Intimate partner violence:    Fear of current or ex partner: Not on file    Emotionally abused: Not on file    Physically abused: Not on file    Forced sexual activity: Not on file  Other Topics Concern  . Not on file  Social History Narrative  . Not on file     BP 132/82   Pulse 87   Ht 5\' 11"  (1.803 m)   Wt 216 lb (98 kg)   SpO2 99%   BMI 30.13 kg/m   Physical Exam:  Well appearing NAD HEENT: Unremarkable Neck:  No JVD, no thyromegally Lymphatics:  No adenopathy Back:  No CVA  tenderness Lungs:  Clear with no wheezes HEART:  Regular rate rhythm, no murmurs, no rubs, no clicks Abd:  soft, positive bowel sounds, no organomegally, no rebound, no guarding Ext:  2 plus pulses, no edema, no cyanosis, no clubbing Skin:  No rashes no nodules Neuro:  CN II through XII intact, motor grossly intact  EKG - nsr  DEVICE  Normal device function.  See PaceArt for details.   Assess/Plan: 1. ICD - his St. Jude ICD is working normally. His pacing impedence remains high but stable. 2. Chronic systolic heart failure - his symptoms are class 2. He feels well.  3. ICM - he denies anginal symptoms. We will follow.

## 2017-12-25 NOTE — Addendum Note (Signed)
Addended by: Vernard Gambles on: 12/25/2017 07:53 AM   Modules accepted: Orders

## 2018-01-07 ENCOUNTER — Ambulatory Visit (INDEPENDENT_AMBULATORY_CARE_PROVIDER_SITE_OTHER): Payer: PPO

## 2018-01-07 ENCOUNTER — Telehealth: Payer: Self-pay

## 2018-01-07 DIAGNOSIS — I255 Ischemic cardiomyopathy: Secondary | ICD-10-CM

## 2018-01-07 NOTE — Telephone Encounter (Signed)
LMOVM reminding pt to send remote transmission.   

## 2018-01-11 NOTE — Progress Notes (Signed)
Remote ICD transmission.   

## 2018-01-13 ENCOUNTER — Encounter: Payer: Self-pay | Admitting: Cardiology

## 2018-02-04 ENCOUNTER — Other Ambulatory Visit: Payer: Self-pay | Admitting: Cardiovascular Disease

## 2018-02-08 ENCOUNTER — Other Ambulatory Visit: Payer: Self-pay

## 2018-02-08 MED ORDER — NITROGLYCERIN 0.4 MG SL SUBL: 0.4000 mg | SUBLINGUAL_TABLET | SUBLINGUAL | 4 refills | Status: DC | PRN

## 2018-02-27 LAB — CUP PACEART REMOTE DEVICE CHECK
Battery Remaining Longevity: 38 mo
Battery Remaining Percentage: 33 %
HighPow Impedance: 51 Ohm
Implantable Lead Implant Date: 20110826
Implantable Lead Location: 753860
Implantable Lead Model: 7121
Implantable Pulse Generator Implant Date: 20110826
Lead Channel Impedance Value: 530 Ohm
Lead Channel Pacing Threshold Amplitude: 1 V
Lead Channel Pacing Threshold Pulse Width: 0.5 ms
Lead Channel Sensing Intrinsic Amplitude: 11.5 mV
Lead Channel Setting Pacing Amplitude: 2.5 V
Lead Channel Setting Pacing Pulse Width: 0.5 ms
Lead Channel Setting Sensing Sensitivity: 0.5 mV
MDC IDC MSMT BATTERY VOLTAGE: 2.89 V
MDC IDC SESS DTM: 20191206041222
MDC IDC STAT BRADY RV PERCENT PACED: 1 %
Pulse Gen Serial Number: 777665

## 2018-03-01 ENCOUNTER — Other Ambulatory Visit: Payer: Self-pay | Admitting: Cardiovascular Disease

## 2018-03-11 ENCOUNTER — Other Ambulatory Visit: Payer: Self-pay

## 2018-03-11 ENCOUNTER — Telehealth: Payer: Self-pay | Admitting: *Deleted

## 2018-03-11 DIAGNOSIS — I255 Ischemic cardiomyopathy: Secondary | ICD-10-CM

## 2018-03-11 NOTE — Progress Notes (Signed)
Orders placed for pre procedure labs.

## 2018-03-11 NOTE — Telephone Encounter (Signed)
Patient called in reporting vibratory alert. Requested manual transmission from home monitor.  Transmission reveals battery performance alert, patient notifier triggered on 03/11/18. Primary prevention ICD, no therapies delivered since DFT testing at implant per device diagnostics. Discussed with Dr. Ladona Ridgel and Bonna Gains, St. Jude rep. Plan for gen change and possible RV lead revision (history of intermittent high RV pacing impedances) on Monday, 03/15/18.  Spoke with patient. He verbalizes understanding of BPA. Will plan to see patient tomorrow, 03/12/18 at 9:00am in [place] to turn off vibratory alert, obtain labs, and to give CHG scrub and pre-op instructions. Judie Grieve will be present to explain warranty information. ED precautions given for any shocks or cardiac symptoms prior to procedure. Patient verbalizes understanding and thanked me for my call.

## 2018-03-12 ENCOUNTER — Ambulatory Visit: Payer: PPO | Admitting: Nurse Practitioner

## 2018-03-12 ENCOUNTER — Other Ambulatory Visit: Payer: PPO

## 2018-03-12 VITALS — BP 118/84 | HR 56 | Ht 71.0 in | Wt 219.0 lb

## 2018-03-12 DIAGNOSIS — I2129 ST elevation (STEMI) myocardial infarction involving other sites: Secondary | ICD-10-CM

## 2018-03-12 DIAGNOSIS — Z01812 Encounter for preprocedural laboratory examination: Secondary | ICD-10-CM | POA: Diagnosis not present

## 2018-03-12 DIAGNOSIS — I5022 Chronic systolic (congestive) heart failure: Secondary | ICD-10-CM

## 2018-03-12 DIAGNOSIS — T82118A Breakdown (mechanical) of other cardiac electronic device, initial encounter: Secondary | ICD-10-CM

## 2018-03-12 NOTE — Progress Notes (Signed)
Electrophysiology Office Note Date: 03/12/2018  ID:  Jeremy Boyle, Jeremy Boyle 02-Aug-1955, MRN [id number]  PCP: Gordan Payment., MD Electrophysiologist: Ladona Ridgel  CC: ICD battery performance alert   Jeremy Boyle is a 63 y.o. male seen today for Dr Ladona Ridgel.  He presents today for routine electrophysiology followup.  Since last being seen in our clinic, the patient reports doing very well. He denies chest pain, palpitations, dyspnea, PND, orthopnea, nausea, vomiting, dizziness, syncope, edema, weight gain, or early satiety.  He has not had ICD shocks. He received a vibratory alert from his device and remote interrogation demonstrated BPA. He was asked to come in today to discuss gen change.  Device History: STJ single chamber ICD implanted 2011 for primary prevention History of appropriate therapy: No History of AAD therapy: No   Past Medical History:  Diagnosis Date  . Acute myocardial infarction of other anterior wall, episode of care unspecified   . Atrial fibrillation ([facility])   . Coronary atherosclerosis of native coronary artery    diagnosed 12/10   . ICD (implantable cardiac defibrillator) in place   . Ischemic cardiomyopathy   . LV (left ventricular) mural thrombus following MI [facility])    Past Surgical History:  Procedure Laterality Date  . BACK SURGERY    . CARDIAC DEFIBRILLATOR PLACEMENT     St Jude  . KNEE SURGERY    . SHOULDER SURGERY    . VASECTOMY      Current Outpatient Medications  Medication Sig Dispense Refill  . acetaminophen (TYLENOL) 500 MG tablet Take 500 mg by mouth every 6 (six) hours as needed (pain.).     Marland Kitchen aspirin EC 81 MG tablet Take 1 tablet (81 mg total) by mouth daily. (Patient taking differently: Take 81 mg by mouth every evening. ) 90 tablet 3  . atorvastatin (LIPITOR) 40 MG tablet TAKE 1 TABLET BY MOUTH ONCE DAILY (Patient taking differently: Take 40 mg by mouth every evening. ) 90 tablet 3  . famotidine (PEPCID) 20 MG tablet Take 20 mg by mouth every  evening.     Marland Kitchen lisinopril (PRINIVIL,ZESTRIL) 10 MG tablet TAKE 1 TABLET BY MOUTH ONCE DAILY (Patient taking differently: Take 10 mg by mouth every evening. ) 90 tablet 3  . metoprolol tartrate (LOPRESSOR) 50 MG tablet TAKE ONE TABLET BY MOUTH 2 TIMES A DAY (Patient taking differently: Take 50 mg by mouth 2 (two) times daily. ) 180 tablet 3  . nitroGLYCERIN (NITROSTAT) 0.4 MG SL tablet Place 1 tablet (0.4 mg total) under the tongue every 5 (five) minutes as needed for chest pain (MAX 3 TABLETS). (Patient taking differently: Place 0.4 mg under the tongue every 5 (five) minutes x 3 doses as needed for chest pain (MAX 3 TABLETS). ) 25 tablet 4  . Polyethyl Glycol-Propyl Glycol (LUBRICANT EYE DROPS) 0.4-0.3 % SOLN Place 1 drop into both eyes 3 (three) times daily as needed (dry/irritated eyes.).    Marland Kitchen XARELTO 20 MG TABS tablet TAKE 1 TABLET ONCE DAILY WITH SUPPER (Patient taking differently: Take 20 mg by mouth daily with supper. ) 30 tablet 2   No current facility-administered medications for this visit.     Allergies:   Patient has no known allergies.   Social History: Social History   Socioeconomic History  . Marital status: Married    Spouse name: Not on file  . Number of children: 2  . Years of education: Not on file  . Highest education level: Not on file  Occupational History  . Not on file  Social Needs  . Financial resource strain: Not on file  . Food insecurity:    Worry: Not on file    Inability: Not on file  . Transportation needs:    Medical: Not on file    Non-medical: Not on file  Tobacco Use  . Smoking status: Former Smoker    Last attempt to quit: 01/03/2009    Years since quitting: 9.1  . Smokeless tobacco: Never Used  Substance and Sexual Activity  . Alcohol use: No  . Drug use: No  . Sexual activity: Not on file  Lifestyle  . Physical activity:    Days per week: Not on file    Minutes per session: Not on file  . Stress: Not on file  Relationships  . Social  connections:    Talks on phone: Not on file    Gets together: Not on file    Attends religious service: Not on file    Active member of club or organization: Not on file    Attends meetings of clubs or organizations: Not on file    Relationship status: Not on file  . Intimate partner violence:    Fear of current or ex partner: Not on file    Emotionally abused: Not on file    Physically abused: Not on file    Forced sexual activity: Not on file  Other Topics Concern  . Not on file  Social History Narrative  . Not on file    Family History: Family History  Problem Relation Age of Onset  . Coronary artery disease Mother        Cabd  . Diabetes Mother   . Heart attack Father        died 60    Review of Systems: All other systems reviewed and are otherwise negative except as noted above.   Physical Exam: VS:  There were no vitals taken for this visit. , BMI There is no height or weight on file to calculate BMI.  GEN- The patient is well appearing, alert and oriented x 3 today.   HEENT: normocephalic, atraumatic; sclera clear, conjunctiva pink; hearing intact; oropharynx clear; neck supple  Lungs- Clear to ausculation bilaterally, normal work of breathing.  No wheezes, rales, rhonchi Heart- Regular rate and rhythm  GI- soft, non-tender, non-distended, bowel sounds present  Extremities- no clubbing, cyanosis, or edema  MS- no significant deformity or atrophy Skin- warm and dry, no rash or lesion; ICD pocket well healed, prominence of device on lateral edge Psych- euthymic mood, full affect Neuro- strength and sensation are intact  ICD interrogation- reviewed in detail today,  See PACEART report  EKG:  EKG is ordered today. The ekg ordered today shows sinus bradycardia, rate 56  Recent Labs: 12/24/2017: ALT 52; BUN 11; Creatinine, Ser 0.72; Hemoglobin 16.3; Platelets 274; Potassium 4.4; Sodium 142   Wt Readings from Last 3 Encounters:  12/25/17 216 lb (98 kg)    12/24/17 215 lb (97.5 kg)  12/05/16 214 lb 6.4 oz (97.3 kg)     Other studies Reviewed: Additional studies/ records that were reviewed today include: Dr Lubertha Basque office note   Assessment and Plan:  1.  Chronic systolic dysfunction euvolemic today Stable on an appropriate medical regimen  2.  Battery performance alert on ICD Pt has had no prior appropriate therapies. Device implanted for primary prevention. He has also had intermittent high RV impedances.  Risks, benefits to gen change and  RV lead revision reviewed with patient who wishes to proceed. Scheduled with Dr Ladona Ridgel on Monday. Per Dr Ladona Ridgel, no need for emergent gen change. Reviewed with patient today. Last dose of Xarelto tonight per Dr Ladona Ridgel He does have device prominence on the lateral edge - reviewed possibility of sub pec implant with patient today.   3.  CAD No recent ischemic symptoms Stable No change required today    Current medicines are reviewed at length with the patient today.   The patient does not have concerns regarding his medicines.  The following changes were made today:  none  Labs/ tests ordered today include: pre-procedure labs No orders of the defined types were placed in this encounter.    Disposition:   Follow up with Dr Ladona Ridgel after gen change     Signed, Gypsy Balsam, NP 03/12/2018 8:21 AM  [facility] [address] [place] [address] [phone] (office) [phone] (fax)

## 2018-03-12 NOTE — Patient Instructions (Signed)
Medication Instructions:  NONE If you need a refill on your cardiac medications before your next appointment, please call your pharmacy.   Lab work:TODAY CBC BMET  If you have labs (blood work) drawn today and your tests are completely normal, you will receive your results only by: Marland Kitchen MyChart Message (if you have MyChart) OR . A paper copy in the mail If you have any lab test that is abnormal or we need to change your treatment, we will call you to review the results.  Testing/Procedures: NONE  Follow-Up: PLEASE SCHEDULE                      10-14 DAYS FROM 2/10 WOUND CHECK                      91 DAYS FROM 2/10 WITH Dr Ladona Ridgel  At [facility], you and your health needs are our priority.  As part of our continuing mission to provide you with exceptional heart care, we have created designated Provider Care Teams.  These Care Teams include your primary Cardiologist (physician) and Advanced Practice Providers (APPs -  Physician Assistants and Nurse Practitioners) who all work together to provide you with the care you need, when you need it. .   Any Other Special Instructions Will Be Listed Below (If Applicable).

## 2018-03-12 NOTE — Addendum Note (Signed)
Addended by: Sigurd Sos on: 03/12/2018 09:40 AM   Modules accepted: Orders

## 2018-03-13 LAB — CBC
Hematocrit: 49.2 % (ref 37.5–51.0)
Hemoglobin: 16.6 g/dL (ref 13.0–17.7)
MCH: 29.6 pg (ref 26.6–33.0)
MCHC: 33.7 g/dL (ref 31.5–35.7)
MCV: 88 fL (ref 79–97)
Platelets: 290 10*3/uL (ref 150–450)
RBC: 5.61 x10E6/uL (ref 4.14–5.80)
RDW: 12.8 % (ref 11.6–15.4)
WBC: 8.2 10*3/uL (ref 3.4–10.8)

## 2018-03-13 LAB — BASIC METABOLIC PANEL
BUN/Creatinine Ratio: 13 (ref 10–24)
BUN: 12 mg/dL (ref 8–27)
CO2: 21 mmol/L (ref 20–29)
CREATININE: 0.9 mg/dL (ref 0.76–1.27)
Calcium: 9.9 mg/dL (ref 8.6–10.2)
Chloride: 102 mmol/L (ref 96–106)
GFR calc Af Amer: 105 mL/min/{1.73_m2} (ref >59)
GFR calc non Af Amer: 91 mL/min/{1.73_m2} (ref >59)
Glucose: 100 mg/dL — ABNORMAL HIGH (ref 65–99)
Potassium: 4.8 mmol/L (ref 3.5–5.2)
SODIUM: 139 mmol/L (ref 134–144)

## 2018-03-15 ENCOUNTER — Encounter (HOSPITAL_COMMUNITY)
Admission: RE | Disposition: A | Discharge status: Home / Self Care | Payer: Self-pay | Source: Home / Self Care | Attending: Internal Medicine

## 2018-03-15 ENCOUNTER — Ambulatory Visit (HOSPITAL_COMMUNITY)
Admission: RE | Admit: 2018-03-15 | Discharge: 2018-03-16 | Disposition: A | Discharge status: Home / Self Care | Payer: PPO | Attending: Internal Medicine | Admitting: Internal Medicine

## 2018-03-15 ENCOUNTER — Telehealth: Payer: Self-pay

## 2018-03-15 ENCOUNTER — Other Ambulatory Visit: Payer: Self-pay

## 2018-03-15 DIAGNOSIS — Z87891 Personal history of nicotine dependence: Secondary | ICD-10-CM | POA: Diagnosis not present

## 2018-03-15 DIAGNOSIS — Z4502 Encounter for adjustment and management of automatic implantable cardiac defibrillator: Secondary | ICD-10-CM | POA: Diagnosis not present

## 2018-03-15 DIAGNOSIS — I252 Old myocardial infarction: Secondary | ICD-10-CM | POA: Insufficient documentation

## 2018-03-15 DIAGNOSIS — Z8249 Family history of ischemic heart disease and other diseases of the circulatory system: Secondary | ICD-10-CM | POA: Diagnosis not present

## 2018-03-15 DIAGNOSIS — Z79899 Other long term (current) drug therapy: Secondary | ICD-10-CM | POA: Insufficient documentation

## 2018-03-15 DIAGNOSIS — Z9852 Vasectomy status: Secondary | ICD-10-CM | POA: Insufficient documentation

## 2018-03-15 DIAGNOSIS — I2510 Atherosclerotic heart disease of native coronary artery without angina pectoris: Secondary | ICD-10-CM | POA: Diagnosis not present

## 2018-03-15 DIAGNOSIS — T82110A Breakdown (mechanical) of cardiac electrode, initial encounter: Secondary | ICD-10-CM | POA: Diagnosis not present

## 2018-03-15 DIAGNOSIS — Z006 Encounter for examination for normal comparison and control in clinical research program: Secondary | ICD-10-CM | POA: Insufficient documentation

## 2018-03-15 DIAGNOSIS — I255 Ischemic cardiomyopathy: Secondary | ICD-10-CM | POA: Diagnosis not present

## 2018-03-15 DIAGNOSIS — Z7982 Long term (current) use of aspirin: Secondary | ICD-10-CM | POA: Insufficient documentation

## 2018-03-15 DIAGNOSIS — Z7901 Long term (current) use of anticoagulants: Secondary | ICD-10-CM | POA: Diagnosis not present

## 2018-03-15 DIAGNOSIS — Z95810 Presence of automatic (implantable) cardiac defibrillator: Secondary | ICD-10-CM

## 2018-03-15 DIAGNOSIS — I5022 Chronic systolic (congestive) heart failure: Secondary | ICD-10-CM | POA: Diagnosis not present

## 2018-03-15 HISTORY — DX: Encounter for adjustment and management of automatic implantable cardiac defibrillator: Z45.02

## 2018-03-15 HISTORY — DX: Chronic systolic (congestive) heart failure: I50.22

## 2018-03-15 HISTORY — PX: LEAD INSERTION: EP1212

## 2018-03-15 HISTORY — PX: ICD GENERATOR CHANGEOUT: EP1231

## 2018-03-15 HISTORY — DX: Breakdown (mechanical) of cardiac electrode, initial encounter: T82.110A

## 2018-03-15 LAB — SURGICAL PCR SCREEN
MRSA, PCR: NEGATIVE
Staphylococcus aureus: NEGATIVE

## 2018-03-15 SURGERY — ICD GENERATOR CHANGEOUT

## 2018-03-15 MED ORDER — LISINOPRIL 10 MG PO TABS
10.0000 mg | ORAL_TABLET | Freq: Every day | ORAL | Status: DC
  Administered 2018-03-15 – 2018-03-16 (×2): 10 mg via ORAL
  Filled 2018-03-15 (×2): qty 1

## 2018-03-15 MED ORDER — CEFAZOLIN SODIUM-DEXTROSE 2-4 GM/100ML-% IV SOLN
2.0000 g | INTRAVENOUS | Status: DC
  Administered 2018-03-15: 2 g via INTRAVENOUS
  Filled 2018-03-15: qty 100

## 2018-03-15 MED ORDER — METOPROLOL TARTRATE 50 MG PO TABS
50.0000 mg | ORAL_TABLET | Freq: Two times a day (BID) | ORAL | Status: DC
  Administered 2018-03-15 – 2018-03-16 (×2): 50 mg via ORAL
  Filled 2018-03-15 (×2): qty 1

## 2018-03-15 MED ORDER — FAMOTIDINE 20 MG PO TABS
20.0000 mg | ORAL_TABLET | Freq: Every evening | ORAL | Status: DC
  Administered 2018-03-15: 20 mg via ORAL
  Filled 2018-03-15: qty 1

## 2018-03-15 MED ORDER — FENTANYL CITRATE (PF) 100 MCG/2ML IJ SOLN
INTRAMUSCULAR | Status: AC
  Filled 2018-03-15: qty 2

## 2018-03-15 MED ORDER — SODIUM CHLORIDE 0.9 % IV SOLN
INTRAVENOUS | Status: DC
  Administered 2018-03-15: 10:00:00 via INTRAVENOUS

## 2018-03-15 MED ORDER — IOPAMIDOL (ISOVUE-370) INJECTION 76%
INTRAVENOUS | Status: AC
  Filled 2018-03-15: qty 50

## 2018-03-15 MED ORDER — SODIUM CHLORIDE 0.9 % IV SOLN
80.0000 mg | INTRAVENOUS | Status: AC
  Administered 2018-03-15: 80 mg
  Filled 2018-03-15: qty 2

## 2018-03-15 MED ORDER — LIDOCAINE HCL (PF) 1 % IJ SOLN
INTRAMUSCULAR | Status: AC
  Filled 2018-03-15: qty 60

## 2018-03-15 MED ORDER — SODIUM CHLORIDE 0.9 % IV SOLN
INTRAVENOUS | Status: AC
  Filled 2018-03-15: qty 2

## 2018-03-15 MED ORDER — MIDAZOLAM HCL 5 MG/5ML IJ SOLN
INTRAMUSCULAR | Status: AC
  Filled 2018-03-15: qty 5

## 2018-03-15 MED ORDER — ACETAMINOPHEN 500 MG PO TABS: 500.0000 mg | ORAL_TABLET | Freq: Four times a day (QID) | ORAL | Status: DC | PRN

## 2018-03-15 MED ORDER — HEPARIN (PORCINE) IN NACL 1000-0.9 UT/500ML-% IV SOLN
INTRAVENOUS | Status: AC
  Filled 2018-03-15: qty 500

## 2018-03-15 MED ORDER — LIDOCAINE HCL (PF) 1 % IJ SOLN
INTRAMUSCULAR | Status: DC | PRN
  Administered 2018-03-15: 20 mL
  Administered 2018-03-15: 30 mL

## 2018-03-15 MED ORDER — MIDAZOLAM HCL 5 MG/5ML IJ SOLN
INTRAMUSCULAR | Status: DC | PRN
  Administered 2018-03-15 (×4): 1 mg via INTRAVENOUS
  Administered 2018-03-15: 2 mg via INTRAVENOUS
  Administered 2018-03-15: 1 mg via INTRAVENOUS
  Administered 2018-03-15: 2 mg via INTRAVENOUS

## 2018-03-15 MED ORDER — HEPARIN (PORCINE) IN NACL 1000-0.9 UT/500ML-% IV SOLN
INTRAVENOUS | Status: DC | PRN
  Administered 2018-03-15: 500 mL

## 2018-03-15 MED ORDER — ACETAMINOPHEN 325 MG PO TABS
325.0000 mg | ORAL_TABLET | ORAL | Status: DC | PRN
  Administered 2018-03-15 (×2): 650 mg via ORAL
  Administered 2018-03-16: 325 mg via ORAL
  Administered 2018-03-16: 650 mg via ORAL
  Filled 2018-03-15 (×4): qty 2

## 2018-03-15 MED ORDER — CEFAZOLIN SODIUM-DEXTROSE 2-4 GM/100ML-% IV SOLN
2.0000 g | INTRAVENOUS | Status: DC
  Filled 2018-03-15: qty 100

## 2018-03-15 MED ORDER — FENTANYL CITRATE (PF) 100 MCG/2ML IJ SOLN
INTRAMUSCULAR | Status: DC | PRN
  Administered 2018-03-15 (×3): 12.5 ug via INTRAVENOUS
  Administered 2018-03-15 (×2): 25 ug via INTRAVENOUS
  Administered 2018-03-15: 12.5 ug via INTRAVENOUS

## 2018-03-15 MED ORDER — MUPIROCIN 2 % EX OINT
TOPICAL_OINTMENT | CUTANEOUS | Status: AC
  Administered 2018-03-15: 10:00:00
  Filled 2018-03-15: qty 22

## 2018-03-15 MED ORDER — ACETAMINOPHEN 325 MG PO TABS
975.0000 mg | ORAL_TABLET | Freq: Four times a day (QID) | ORAL | Status: DC | PRN
  Administered 2018-03-15: 975 mg via ORAL

## 2018-03-15 MED ORDER — ACETAMINOPHEN 325 MG PO TABS
ORAL_TABLET | ORAL | Status: AC
  Filled 2018-03-15: qty 3

## 2018-03-15 MED ORDER — CHLORHEXIDINE GLUCONATE 4 % EX LIQD
60.0000 mL | Freq: Once | CUTANEOUS | Status: DC
  Filled 2018-03-15: qty 60

## 2018-03-15 MED ORDER — CEFAZOLIN SODIUM-DEXTROSE 1-4 GM/50ML-% IV SOLN
1.0000 g | Freq: Four times a day (QID) | INTRAVENOUS | Status: AC
  Administered 2018-03-15 – 2018-03-16 (×3): 1 g via INTRAVENOUS
  Filled 2018-03-15 (×3): qty 50

## 2018-03-15 MED ORDER — ARTIFICIAL TEARS OPHTHALMIC OINT
1.0000 "application " | TOPICAL_OINTMENT | Freq: Three times a day (TID) | OPHTHALMIC | Status: DC | PRN
  Filled 2018-03-15: qty 3.5

## 2018-03-15 MED ORDER — ONDANSETRON HCL 4 MG/2ML IJ SOLN
4.0000 mg | Freq: Four times a day (QID) | INTRAMUSCULAR | Status: DC | PRN
  Filled 2018-03-15: qty 2

## 2018-03-15 SURGICAL SUPPLY — 6 items
CABLE SURGICAL S-101-97-12 (CABLE) ×3 IMPLANT
ICD ELLIPSE VR CD1411-36C (ICD Generator) ×3 IMPLANT
LEAD DURATA 7122-65CM (Lead) ×3 IMPLANT
PAD PRO RADIOLUCENT 2001M-C (PAD) ×3 IMPLANT
SHEATH CLASSIC 7F (SHEATH) ×3 IMPLANT
TRAY PACEMAKER INSERTION (PACKS) ×3 IMPLANT

## 2018-03-15 NOTE — Telephone Encounter (Signed)
-----   Message from Marily Lente, NP sent at 03/14/2018 10:37 AM EST ----- Please notify patient of stable labs. Thanks!

## 2018-03-15 NOTE — H&P (Signed)
Electrophysiology Office Note Date: 03/12/2018  ID:  Ranard, [place] 04-27-1955, MRN [id number]  PCP: Gordan Payment., MD Electrophysiologist: Ladona Ridgel  CC: ICD battery performance alert   Jeremy Boyle is a 63 y.o. male seen today for Dr Ladona Ridgel.  He presents today for routine electrophysiology followup.  Since last being seen in our clinic, the patient reports doing very well. He denies chest pain, palpitations, dyspnea, PND, orthopnea, nausea, vomiting, dizziness, syncope, edema, weight gain, or early satiety.  He has not had ICD shocks. He received a vibratory alert from his device and remote interrogation demonstrated BPA. He was asked to come in today to discuss gen change.  Device History: STJ single chamber ICD implanted 2011 for primary prevention History of appropriate therapy: No History of AAD therapy: No       Past Medical History:  Diagnosis Date  . Acute myocardial infarction of other anterior wall, episode of care unspecified   . Atrial fibrillation (HCC)   . Coronary atherosclerosis of native coronary artery    diagnosed 12/10   . ICD (implantable cardiac defibrillator) in place   . Ischemic cardiomyopathy   . LV (left ventricular) mural thrombus following MI [facility])         Past Surgical History:  Procedure Laterality Date  . BACK SURGERY    . CARDIAC DEFIBRILLATOR PLACEMENT     [employer]  . KNEE SURGERY    . SHOULDER SURGERY    . VASECTOMY            Current Outpatient Medications  Medication Sig Dispense Refill  . acetaminophen (TYLENOL) 500 MG tablet Take 500 mg by mouth every 6 (six) hours as needed (pain.).     Marland Kitchen aspirin EC 81 MG tablet Take 1 tablet (81 mg total) by mouth daily. (Patient taking differently: Take 81 mg by mouth every evening. ) 90 tablet 3  . atorvastatin (LIPITOR) 40 MG tablet TAKE 1 TABLET BY MOUTH ONCE DAILY (Patient taking differently: Take 40 mg by mouth every evening. ) 90 tablet 3  .  famotidine (PEPCID) 20 MG tablet Take 20 mg by mouth every evening.     Marland Kitchen lisinopril (PRINIVIL,ZESTRIL) 10 MG tablet TAKE 1 TABLET BY MOUTH ONCE DAILY (Patient taking differently: Take 10 mg by mouth every evening. ) 90 tablet 3  . metoprolol tartrate (LOPRESSOR) 50 MG tablet TAKE ONE TABLET BY MOUTH 2 TIMES A DAY (Patient taking differently: Take 50 mg by mouth 2 (two) times daily. ) 180 tablet 3  . nitroGLYCERIN (NITROSTAT) 0.4 MG SL tablet Place 1 tablet (0.4 mg total) under the tongue every 5 (five) minutes as needed for chest pain (MAX 3 TABLETS). (Patient taking differently: Place 0.4 mg under the tongue every 5 (five) minutes x 3 doses as needed for chest pain (MAX 3 TABLETS). ) 25 tablet 4  . Polyethyl Glycol-Propyl Glycol (LUBRICANT EYE DROPS) 0.4-0.3 % SOLN Place 1 drop into both eyes 3 (three) times daily as needed (dry/irritated eyes.).    Marland Kitchen XARELTO 20 MG TABS tablet TAKE 1 TABLET ONCE DAILY WITH SUPPER (Patient taking differently: Take 20 mg by mouth daily with supper. ) 30 tablet 2   No current facility-administered medications for this visit.     Allergies:   Patient has no known allergies.   Social History: Social History        Socioeconomic History  . Marital status: Married    Spouse name: Not on file  .  Number of children: 2  . Years of education: Not on file  . Highest education level: Not on file  Occupational History  . Not on file  Social Needs  . Financial resource strain: Not on file  . Food insecurity:    Worry: Not on file    Inability: Not on file  . Transportation needs:    Medical: Not on file    Non-medical: Not on file  Tobacco Use  . Smoking status: Former Smoker    Last attempt to quit: 01/03/2009    Years since quitting: 9.1  . Smokeless tobacco: Never Used  Substance and Sexual Activity  . Alcohol use: No  . Drug use: No  . Sexual activity: Not on file  Lifestyle  . Physical activity:    Days per week: Not on file     Minutes per session: Not on file  . Stress: Not on file  Relationships  . Social connections:    Talks on phone: Not on file    Gets together: Not on file    Attends religious service: Not on file    Active member of club or organization: Not on file    Attends meetings of clubs or organizations: Not on file    Relationship status: Not on file  . Intimate partner violence:    Fear of current or ex partner: Not on file    Emotionally abused: Not on file    Physically abused: Not on file    Forced sexual activity: Not on file  Other Topics Concern  . Not on file  Social History Narrative  . Not on file    Family History:      Family History  Problem Relation Age of Onset  . Coronary artery disease Mother        Cabd  . Diabetes Mother   . Heart attack Father        died 38    Review of Systems: All other systems reviewed and are otherwise negative except as noted above.   Physical Exam: VS:  There were no vitals taken for this visit. , BMI There is no height or weight on file to calculate BMI.  GEN- The patient is well appearing, alert and oriented x 3 today.   HEENT: normocephalic, atraumatic; sclera clear, conjunctiva pink; hearing intact; oropharynx clear; neck supple  Lungs- Clear to ausculation bilaterally, normal work of breathing.  No wheezes, rales, rhonchi Heart- Regular rate and rhythm  GI- soft, non-tender, non-distended, bowel sounds present  Extremities- no clubbing, cyanosis, or edema  MS- no significant deformity or atrophy Skin- warm and dry, no rash or lesion; ICD pocket well healed, prominence of device on lateral edge Psych- euthymic mood, full affect Neuro- strength and sensation are intact  ICD interrogation- reviewed in detail today,  See PACEART report  EKG:  EKG is ordered today. The ekg ordered today shows sinus bradycardia, rate 56  Recent Labs: 12/24/2017: ALT 52; BUN 11; Creatinine, Ser 0.72;  Hemoglobin 16.3; Platelets 274; Potassium 4.4; Sodium 142      Wt Readings from Last 3 Encounters:  12/25/17 216 lb (98 kg)  12/24/17 215 lb (97.5 kg)  12/05/16 214 lb 6.4 oz (97.3 kg)     Other studies Reviewed: Additional studies/ records that were reviewed today include: Dr Lubertha Basque office note   Assessment and Plan:  1.  Chronic systolic dysfunction euvolemic today Stable on an appropriate medical regimen  2.  Battery performance alert on  ICD Pt has had no prior appropriate therapies. Device implanted for primary prevention. He has also had intermittent high RV impedances.  Risks, benefits to gen change and RV lead revision reviewed with patient who wishes to proceed. Scheduled with Dr Ladona Ridgel on Monday. Per Dr Ladona Ridgel, no need for emergent gen change. Reviewed with patient today. Last dose of Xarelto tonight per Dr Ladona Ridgel He does have device prominence on the lateral edge - reviewed possibility of sub pec implant with patient today.   3.  CAD No recent ischemic symptoms Stable No change required today    Current medicines are reviewed at length with the patient today.   The patient does not have concerns regarding his medicines.  The following changes were made today:  none  Labs/ tests ordered today include: pre-procedure labs No orders of the defined types were placed in this encounter.    Disposition:   Follow up with Dr Ladona Ridgel after gen change     Signed, Gypsy Balsam, NP 03/12/2018 8:21 AM  EP Attending  Patient seen and examined. Agree with the findings as noted above. I have reviewed the findings with Gypsy Balsam, NP-C. Also discussed with the patient. He has had a HV lead impedence problems but has developed premature battery depletion. He is at end of service due to a battery perfomance alert. He will undergo gen change out, insertion of a new lead and we will plan to place device in a subpectoral location.  Leonia Reeves.D.

## 2018-03-15 NOTE — Discharge Summary (Addendum)
ELECTROPHYSIOLOGY PROCEDURE DISCHARGE SUMMARY    Patient ID: Jeremy Boyle,  MRN: [id number], DOB/AGE: July 12, 1955 63 y.o.  Admit date: 03/15/2018 Discharge date: 03/16/2018  Primary Care Physician: Gordan Payment., MD Primary Cardiologist: Dr. Clifton James Electrophysiologist: Dr. Ladona Ridgel  Primary Discharge Diagnosis:  1. 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 CAD 2. H/o LV thrombus 3. Paroxysmal Afib     CHA2DS2Vasc is 4, on xarelto  No Known Allergies   Procedures This Admission:  1.  Implantation of a new RV lead and generator replacement (SJM) on 03/15/2018 by Dr Ladona Ridgel.  The patient received a Event organiser defibrillation lead, serial number [id number], [facility] defibrillator, serial [id number] Pocket relocation 2.  CXR on demonstrated no pneumothorax status post device implantation.   Brief HPI: Jeremy Boyle is a 63 y.o. male is followed in the out patient setting.  He received a vibratory alert for PBA, his RV also had been noted to have intermittent high impedances.  Given need for generator change, planned as well for a new RV lead.Risks, benefits, and alternatives to ICD implantation were reviewed with the patient who wished to proceed.   Hospital Course:  The patient was admitted and underwent implantation of a new RV lead and generator change with details as outlined above.  He was monitored on telemetry overnight which demonstrated SR, rare V pacing.  Left chest was without hematoma or ecchymosis.  The device was interrogated and found to be functioning normally.  CXR was obtained and demonstrated no pneumothorax status post device implantation.  Wound care, arm mobility, and restrictions were reviewed with the patient.  The patient feels well this morning, no CP or SOB, he was examined by  Dr. Ladona Ridgel and considered stable for discharge to home.    Physical Exam: Vitals:   03/16/18 0643 03/16/18 0756 03/16/18  0800 03/16/18 1010  BP: 124/77 135/82 (!) 122/93 (!) 147/83  Pulse:  80 79   Resp:      Temp:  (!) 97.5 F (36.4 C) 98 F (36.7 C)   TempSrc:  Oral Oral   SpO2:  100% 97%   Weight:      Height:        GEN- The patient is well appearing, alert and oriented x 3 today.   HEENT: normocephalic, atraumatic; sclera clear, conjunctiva pink; hearing intact; oropharynx clear Lungs- CTA b/l, normal work of breathing.  No wheezes, rales, rhonchi Heart- RRR, no murmurs, rubs or gallops, PMI not laterally displaced GI- soft, non-tender, non-distended Extremities- no clubbing, cyanosis, or edema MS- no significant deformity or atrophy Skin- warm and dry, no rash or lesion, left chest without hematoma/ecchymosis Psych- euthymic mood, full affect Neuro- no gross defecits  Labs:   Lab Results  Component Value Date   WBC 8.2 03/12/2018   HGB 16.6 03/12/2018   HCT 49.2 03/12/2018   MCV 88 03/12/2018   PLT 290 03/12/2018    Recent Labs  Lab 03/12/18 0940  NA 139  K 4.8  CL 102  CO2 21  BUN 12  CREATININE 0.90  CALCIUM 9.9  GLUCOSE 100*    Discharge Medications:  Allergies as of 03/16/2018   No Known Allergies     Medication List    TAKE these medications   acetaminophen 500 MG tablet Commonly known as:  TYLENOL Take 500 mg by mouth every 6 (six) hours as needed (pain.).   aspirin EC 81  MG tablet Take 1 tablet (81 mg total) by mouth daily. What changed:  when to take this Notes to patient:  Resume on Friday 03/19/2018   atorvastatin 40 MG tablet Commonly known as:  LIPITOR TAKE 1 TABLET BY MOUTH ONCE DAILY What changed:  when to take this   famotidine 20 MG tablet Commonly known as:  PEPCID Take 20 mg by mouth every evening.   lisinopril 10 MG tablet Commonly known as:  PRINIVIL,ZESTRIL TAKE 1 TABLET BY MOUTH ONCE DAILY What changed:  when to take this   LUBRICANT EYE DROPS 0.4-0.3 % Soln Generic drug:  Polyethyl Glycol-Propyl Glycol Place 1 drop into both eyes  3 (three) times daily as needed (dry/irritated eyes.).   metoprolol tartrate 50 MG tablet Commonly known as:  LOPRESSOR TAKE ONE TABLET BY MOUTH 2 TIMES A DAY What changed:  See the new instructions.   nitroGLYCERIN 0.4 MG SL tablet Commonly known as:  NITROSTAT Place 1 tablet (0.4 mg total) under the tongue every 5 (five) minutes as needed for chest pain (MAX 3 TABLETS). What changed:  when to take this   XARELTO 20 MG Tabs tablet Generic drug:  rivaroxaban TAKE 1 TABLET ONCE DAILY WITH SUPPER What changed:  See the new instructions. Notes to patient:  Resume on Sunday 03/21/2018       Disposition:  Home  Discharge Instructions    Diet - low sodium heart healthy   Complete by:  As directed    Increase activity slowly   Complete by:  As directed      Follow-up Information    [facility] Follow up.   Specialty:  Cardiology Why:  03/25/2018 @ 10:00AM, wound check visit Contact information: [address] [place] [address] [phone]       Marinus Maw, MD Follow up.   Specialty:  Cardiology Why:  06/22/2018 @ 3:30PM Contact information: [address] [place] [address] [phone]           Duration of Discharge Encounter: Greater than 30 minutes including physician time.  Norma Fredrickson, PA-C 03/16/2018 11:12 AM  EP Attending  Patient seen and examined. Agree with the findings as noted above. The patient is s/p ICD gen change out with insertion of a new ICD lead. His device has been interrogated under my supervision and is working normally. Usual followup.   Leonia Reeves.D.

## 2018-03-15 NOTE — Progress Notes (Signed)
Orthopedic Tech Progress Note Patient Details:  SUYASH SCHACK [facility] 1955-04-01 [id number] RN said patient has on sling Patient ID: Jeremy Boyle, male   DOB: 22-Jul-1955, 63 y.o.   MRN: [id number]   Donald Pore 03/15/2018, 5:39 PM

## 2018-03-15 NOTE — Telephone Encounter (Signed)
Notes recorded by Sigurd Sos, RN on 03/15/2018 at 8:10 AM EST The patient has been notified of the result and verbalized understanding. All questions (if any) were answered. Sigurd Sos, RN 03/15/2018 8:10 AM

## 2018-03-15 NOTE — Discharge Instructions (Signed)
° ° °  Supplemental Discharge Instructions for  Pacemaker/Defibrillator Patients  Activity No heavy lifting or vigorous activity with your left/right arm for 6 to 8 weeks.  Do not raise your left/right arm above your head for one week.  Gradually raise your affected arm as drawn below.             03/19/2018                 03/20/2018                03/21/2018               03/22/2018 __  NO DRIVING for  1 week   ; you may begin driving on  1/54/0086   .  WOUND CARE - Keep the wound area clean and dry.  Do not get this area wet for one week. No showers for one week; you may shower on  03/22/2018   . - The tape/steri-strips on your wound will fall off; do not pull them off.  No bandage is needed on the site.  DO  NOT apply any creams, oils, or ointments to the wound area. - If you notice any drainage or discharge from the wound, any swelling or bruising at the site, or you develop a fever > 101? F after you are discharged home, call the office at once.  Special Instructions - You are still able to use cellular telephones; use the ear opposite the side where you have your pacemaker/defibrillator.  Avoid carrying your cellular phone near your device. - When traveling through airports, show security personnel your identification card to avoid being screened in the metal detectors.  Ask the security personnel to use the hand wand. - Avoid arc welding equipment, MRI testing (magnetic resonance imaging), TENS units (transcutaneous nerve stimulators).  Call the office for questions about other devices. - Avoid electrical appliances that are in poor condition or are not properly grounded. - Microwave ovens are safe to be near or to operate.  Additional information for defibrillator patients should your device go off: - If your device goes off ONCE and you feel fine afterward, notify the device clinic nurses. - If your device goes off ONCE and you do not feel well afterward, call 911. - If your device  goes off TWICE, call 911. - If your device goes off THREE times in one day, call 911.  DO NOT DRIVE YOURSELF OR A FAMILY MEMBER WITH A DEFIBRILLATOR TO THE HOSPITAL--CALL 911.

## 2018-03-16 ENCOUNTER — Encounter (HOSPITAL_COMMUNITY): Payer: Self-pay | Admitting: Internal Medicine

## 2018-03-16 ENCOUNTER — Ambulatory Visit (HOSPITAL_COMMUNITY): Payer: PPO

## 2018-03-16 DIAGNOSIS — I252 Old myocardial infarction: Secondary | ICD-10-CM | POA: Diagnosis not present

## 2018-03-16 DIAGNOSIS — T82110A Breakdown (mechanical) of cardiac electrode, initial encounter: Secondary | ICD-10-CM | POA: Diagnosis not present

## 2018-03-16 DIAGNOSIS — I2510 Atherosclerotic heart disease of native coronary artery without angina pectoris: Secondary | ICD-10-CM | POA: Diagnosis not present

## 2018-03-16 DIAGNOSIS — Z9852 Vasectomy status: Secondary | ICD-10-CM | POA: Diagnosis not present

## 2018-03-16 DIAGNOSIS — Z79899 Other long term (current) drug therapy: Secondary | ICD-10-CM | POA: Diagnosis not present

## 2018-03-16 DIAGNOSIS — Z7901 Long term (current) use of anticoagulants: Secondary | ICD-10-CM | POA: Diagnosis not present

## 2018-03-16 DIAGNOSIS — I255 Ischemic cardiomyopathy: Secondary | ICD-10-CM | POA: Diagnosis not present

## 2018-03-16 DIAGNOSIS — I5022 Chronic systolic (congestive) heart failure: Secondary | ICD-10-CM

## 2018-03-16 DIAGNOSIS — Z4502 Encounter for adjustment and management of automatic implantable cardiac defibrillator: Secondary | ICD-10-CM | POA: Diagnosis not present

## 2018-03-16 DIAGNOSIS — Z8249 Family history of ischemic heart disease and other diseases of the circulatory system: Secondary | ICD-10-CM | POA: Diagnosis not present

## 2018-03-16 DIAGNOSIS — Z006 Encounter for examination for normal comparison and control in clinical research program: Secondary | ICD-10-CM | POA: Diagnosis not present

## 2018-03-16 DIAGNOSIS — Z7982 Long term (current) use of aspirin: Secondary | ICD-10-CM | POA: Diagnosis not present

## 2018-03-16 DIAGNOSIS — Z87891 Personal history of nicotine dependence: Secondary | ICD-10-CM | POA: Diagnosis not present

## 2018-03-16 NOTE — Progress Notes (Signed)
Pt received NaCl bolus of 500 ml while having x-ray done this morning due to low BP.  See Cardiology note for details. Pt is now stable.

## 2018-03-16 NOTE — Progress Notes (Addendum)
   Received page from RN that patient had episode of nausea, diaphoresis, and blurry vision while getting chest x-ray this morning. Per RN, patient was also "grey." No chest pain or shortness of breath. Patient noted to be bradycardic with heart rates in the 30s and systolic BP in the 60's to 80's at the time. Episode lasted about 10 minutes and then resolves. I went to see patient and he is currently asymptomatic. Heart rates in the 60's and systolic BP in the 110's. Checking morning EKG now. Will notify rounding team.   Corrin Parker, PA-C 03/16/2018 6:43 AM

## 2018-03-16 NOTE — Progress Notes (Signed)
Patient d/ced with wife via WC.  Copy of d/c instructions with patient, reviewed all instructions, patient verbalized understanding.   IV d/ced without problems.

## 2018-03-17 ENCOUNTER — Encounter (HOSPITAL_COMMUNITY): Payer: Self-pay | Admitting: Internal Medicine

## 2018-03-20 NOTE — H&P (Signed)
ICD Criteria  Current LVEF:45%. Within 12 months prior to implant: No   Heart failure history: Yes, Class II  Cardiomyopathy history: Yes, Ischemic Cardiomyopathy - Prior MI.  Atrial Fibrillation/Atrial Flutter: No.  Ventricular tachycardia history: No.  Cardiac arrest history: No.  History of syndromes with risk of sudden death: No.  Previous ICD: Yes, Reason for ICD:  Primary prevention.  Current ICD indication: Primary  PPM indication: No.  Class I or II Bradycardia indication present: no  Beta Blocker therapy for 3 or more months: Yes, prescribed.   Ace Inhibitor/ARB therapy for 3 or more months: Yes, prescribed.    I have seen Jeremy Boyle [name] is a 63 y.o. malepre-procedural and has been referred by Dr. Sanjuana Kava for consideration of ICD implant for primary prevention of sudden death.  The patient's chart has been reviewed and they meet criteria for ICD implant.  I have had a thorough discussion with the patient reviewing options.  The patient and their family (if available) have had opportunities to ask questions and have them answered. The patient and I have decided together through the [facility] Heart Care Share Decision Support Tool to proceed with ICD at this time.  Risks, benefits, alternatives to ICD implantation were discussed in detail with the patient today. The patient  understands that the risks include but are not limited to bleeding, infection, pneumothorax, perforation, tamponade, vascular damage, renal failure, MI, stroke, death, inappropriate shocks, and lead dislodgement and he wishes to proceed.

## 2018-03-23 ENCOUNTER — Telehealth: Payer: Self-pay | Admitting: Cardiology

## 2018-03-23 NOTE — Telephone Encounter (Signed)
Patient called and stated that he has a small lump over his device site. He stated that it is squeasy. After consulting w/ Device Tech RN I explained to the patient that the area will be assessed on Thursday at his appt w/ AS, NP. Patient verbalized understanding and said he had no pain.

## 2018-03-25 ENCOUNTER — Ambulatory Visit (INDEPENDENT_AMBULATORY_CARE_PROVIDER_SITE_OTHER): Payer: PPO | Admitting: Nurse Practitioner

## 2018-03-25 ENCOUNTER — Ambulatory Visit
Admission: RE | Admit: 2018-03-25 | Discharge: 2018-03-25 | Disposition: A | Discharge status: Home / Self Care | Payer: PPO | Source: Ambulatory Visit | Attending: Nurse Practitioner | Admitting: Nurse Practitioner

## 2018-03-25 DIAGNOSIS — I5022 Chronic systolic (congestive) heart failure: Secondary | ICD-10-CM

## 2018-03-25 DIAGNOSIS — Z95810 Presence of automatic (implantable) cardiac defibrillator: Secondary | ICD-10-CM

## 2018-03-25 LAB — CUP PACEART INCLINIC DEVICE CHECK
Date Time Interrogation Session: 20200220095347
Implantable Lead Implant Date: 20110826
Implantable Lead Implant Date: 20200207
Implantable Lead Location: 753860
Implantable Lead Model: 7121
Implantable Lead Model: 7121
Implantable Pulse Generator Implant Date: 20200207
MDC IDC LEAD LOCATION: 753860
MDC IDC PG SERIAL: 9793547

## 2018-03-25 NOTE — Progress Notes (Signed)
Wound check appointment. Steri-strips removed. Wound without redness or edema. Incision edges approximated, wound well healed. Normal device function. RV sensing down to 3.61mV today. Threshold increased to 1.25V from 0.5V at implant. Impedance 380 ohms, was 600 at implant. Device programmed at 3.5V for extra safety margin until 3 month visit. Histogram distribution appropriate for patient and level of activity. No ventricular arrhythmias noted. will obtain CXR to evaluate lead position. Patient educated about wound care, arm mobility, lifting restrictions, shock plan. ROV in 3 months with implanting physician.

## 2018-03-26 ENCOUNTER — Telehealth: Payer: Self-pay

## 2018-03-26 NOTE — Telephone Encounter (Signed)
Notes recorded by Sigurd Sos, RN on 03/26/2018 at 10:19 AM EST The patient has been notified of the result and verbalized understanding. All questions (if any) were answered. Sigurd Sos, RN 03/26/2018 10:19 AM

## 2018-03-26 NOTE — Telephone Encounter (Signed)
-----   Message from Marily Lente, NP sent at 03/25/2018  7:46 PM EST ----- Please notify patient of CXR results. Lead in stable position. Thanks!

## 2018-03-26 NOTE — Telephone Encounter (Signed)
-----   Message from Amber K Seiler, NP sent at 03/25/2018  7:46 PM EST ----- Please notify patient of CXR results. Lead in stable position. Thanks! 

## 2018-03-30 ENCOUNTER — Other Ambulatory Visit: Payer: PPO | Admitting: *Deleted

## 2018-03-30 DIAGNOSIS — E7800 Pure hypercholesterolemia, unspecified: Secondary | ICD-10-CM

## 2018-03-30 LAB — LIPID PANEL
Chol/HDL Ratio: 4.8 ratio (ref 0.0–5.0)
Cholesterol, Total: 167 mg/dL (ref 100–199)
HDL: 35 mg/dL — ABNORMAL LOW (ref >39)
LDL Calculated: 79 mg/dL (ref 0–99)
TRIGLYCERIDES: 263 mg/dL — AB (ref 0–149)
VLDL CHOLESTEROL CAL: 53 mg/dL — AB (ref 5–40)

## 2018-03-31 ENCOUNTER — Telehealth: Payer: Self-pay | Admitting: *Deleted

## 2018-03-31 DIAGNOSIS — E7800 Pure hypercholesterolemia, unspecified: Secondary | ICD-10-CM

## 2018-03-31 MED ORDER — ATORVASTATIN CALCIUM 80 MG PO TABS: 80.0000 mg | ORAL_TABLET | Freq: Every day | ORAL | 11 refills | Status: DC

## 2018-03-31 NOTE — Telephone Encounter (Signed)
Pt notified. He is seeing Dr. Ladona Ridgel on May 19,2020 and would like to have lab work done that day.  He will eat a light breakfast and then fast until appointment.  Will send new prescription to [facility].

## 2018-03-31 NOTE — Telephone Encounter (Signed)
-----   Message from Kathleene Hazel, MD sent at 03/31/2018  8:01 AM EST ----- LDL was 90 and now 79 after dietary changes. See phone note NOvember. I would recommend we increase his Lipitor to 80 mg daily and repeat lipids and LFTs in 12 weeks. cdm

## 2018-04-01 DIAGNOSIS — Z23 Encounter for immunization: Secondary | ICD-10-CM | POA: Diagnosis not present

## 2018-04-01 DIAGNOSIS — S61411A Laceration without foreign body of right hand, initial encounter: Secondary | ICD-10-CM | POA: Diagnosis not present

## 2018-04-05 ENCOUNTER — Other Ambulatory Visit: Payer: Self-pay | Admitting: Cardiovascular Disease

## 2018-04-08 DIAGNOSIS — Z4802 Encounter for removal of sutures: Secondary | ICD-10-CM | POA: Diagnosis not present

## 2018-06-22 ENCOUNTER — Other Ambulatory Visit: Payer: PPO

## 2018-06-22 ENCOUNTER — Encounter: Payer: PPO | Admitting: Internal Medicine

## 2018-06-29 ENCOUNTER — Telehealth: Payer: Self-pay | Admitting: Cardiology

## 2018-06-29 NOTE — Telephone Encounter (Signed)
Discussed with Bonna Gains, [employer] rep. He will review notes regarding warranty.

## 2018-06-29 NOTE — Telephone Encounter (Signed)
Patient has concerns about lead replacement bill in 03/12/2018. He was told that the lead would be covered under warranty but he received a bill for the lead. He called [employer] and they said they would have to investigate the matter and they are not sure if the lead would be covered.

## 2018-06-30 NOTE — Telephone Encounter (Signed)
Spoke with patient. He reports he has received a denial from his insurance company and has received bills from [facility] for the procedure. He has not received a denial from [employer]. Gave number for Patient Financial Services. Will also ask Bonna Gains to investigate further and contact the patient directly. Pt is agreeable to plan and thanked me for my call.

## 2018-06-30 NOTE — Telephone Encounter (Signed)
Noted. He should not pay bill. GT

## 2018-07-09 ENCOUNTER — Ambulatory Visit (INDEPENDENT_AMBULATORY_CARE_PROVIDER_SITE_OTHER): Payer: PPO | Admitting: *Deleted

## 2018-07-09 DIAGNOSIS — I255 Ischemic cardiomyopathy: Secondary | ICD-10-CM

## 2018-07-09 LAB — CUP PACEART REMOTE DEVICE CHECK
Battery Remaining Longevity: 85 mo
Battery Remaining Percentage: 89 %
Battery Voltage: 3.11 V
Brady Statistic RV Percent Paced: 1 %
Date Time Interrogation Session: 20200605030835
HighPow Impedance: 50 Ohm
HighPow Impedance: 50 Ohm
Implantable Lead Implant Date: 20110826
Implantable Lead Implant Date: 20200207
Implantable Lead Location: 753860
Implantable Lead Location: 753860
Implantable Lead Model: 7121
Implantable Lead Model: 7121
Implantable Pulse Generator Implant Date: 20200207
Lead Channel Impedance Value: 400 Ohm
Lead Channel Pacing Threshold Amplitude: 1.25 V
Lead Channel Pacing Threshold Pulse Width: 0.5 ms
Lead Channel Sensing Intrinsic Amplitude: 9.1 mV
Lead Channel Setting Pacing Amplitude: 3.5 V
Lead Channel Setting Pacing Pulse Width: 0.5 ms
Lead Channel Setting Sensing Sensitivity: 0.5 mV
Pulse Gen Serial Number: 9793547

## 2018-07-16 ENCOUNTER — Encounter: Payer: Self-pay | Admitting: Cardiology

## 2018-07-16 NOTE — Progress Notes (Signed)
Remote ICD transmission.   

## 2018-07-20 ENCOUNTER — Telehealth: Payer: Self-pay

## 2018-07-20 NOTE — Telephone Encounter (Signed)

## 2018-07-27 ENCOUNTER — Encounter: Payer: Self-pay | Admitting: Internal Medicine

## 2018-07-27 ENCOUNTER — Ambulatory Visit: Payer: PPO | Admitting: Internal Medicine

## 2018-07-27 ENCOUNTER — Other Ambulatory Visit: Payer: Self-pay

## 2018-07-27 ENCOUNTER — Encounter (INDEPENDENT_AMBULATORY_CARE_PROVIDER_SITE_OTHER): Payer: Self-pay

## 2018-07-27 VITALS — BP 120/82 | HR 64 | Ht 71.0 in | Wt 218.0 lb

## 2018-07-27 DIAGNOSIS — I5022 Chronic systolic (congestive) heart failure: Secondary | ICD-10-CM

## 2018-07-27 DIAGNOSIS — Z95810 Presence of automatic (implantable) cardiac defibrillator: Secondary | ICD-10-CM | POA: Diagnosis not present

## 2018-07-27 DIAGNOSIS — I255 Ischemic cardiomyopathy: Secondary | ICD-10-CM | POA: Diagnosis not present

## 2018-07-27 HISTORY — DX: Ischemic cardiomyopathy: I25.5

## 2018-07-27 LAB — CUP PACEART INCLINIC DEVICE CHECK
Battery Remaining Longevity: 92 mo
Brady Statistic RV Percent Paced: 0.02 %
Date Time Interrogation Session: 20200623092235
HighPow Impedance: 53.5801
Implantable Lead Implant Date: 20200207
Implantable Lead Location: 753860
Implantable Lead Model: 7121
Implantable Pulse Generator Implant Date: 20200207
Lead Channel Impedance Value: 412.5 Ohm
Lead Channel Pacing Threshold Amplitude: 1 V
Lead Channel Pacing Threshold Pulse Width: 0.5 ms
Lead Channel Sensing Intrinsic Amplitude: 10.1 mV
Lead Channel Setting Pacing Amplitude: 3.5 V
Lead Channel Setting Pacing Pulse Width: 0.5 ms
Lead Channel Setting Sensing Sensitivity: 0.5 mV
Pulse Gen Serial Number: 9793547

## 2018-07-27 NOTE — Progress Notes (Signed)
HPI [name]. [name] returns today for ongoing evaluation and management of his ICD and chronic systolic heart failure. He underwent ICD generator change out 4 months ago. He initially had pain after his initial ICD implant but has had none since. He denies chest pain or sob. No edema. No Known Allergies   Current Outpatient Medications  Medication Sig Dispense Refill  . acetaminophen (TYLENOL) 500 MG tablet Take 500 mg by mouth every 6 (six) hours as needed (pain.).     Marland Kitchen aspirin EC 81 MG tablet Take 1 tablet (81 mg total) by mouth daily. (Patient taking differently: Take 81 mg by mouth every evening. ) 90 tablet 3  . atorvastatin (LIPITOR) 80 MG tablet Take 1 tablet (80 mg total) by mouth daily. 30 tablet 11  . lisinopril (PRINIVIL,ZESTRIL) 10 MG tablet TAKE 1 TABLET BY MOUTH ONCE DAILY (Patient taking differently: Take 10 mg by mouth every evening. ) 90 tablet 3  . metoprolol tartrate (LOPRESSOR) 50 MG tablet TAKE ONE TABLET BY MOUTH 2 TIMES A DAY (Patient taking differently: Take 50 mg by mouth 2 (two) times daily. ) 180 tablet 3  . nitroGLYCERIN (NITROSTAT) 0.4 MG SL tablet Place 1 tablet (0.4 mg total) under the tongue every 5 (five) minutes as needed for chest pain (MAX 3 TABLETS). (Patient taking differently: Place 0.4 mg under the tongue every 5 (five) minutes x 3 doses as needed for chest pain (MAX 3 TABLETS). ) 25 tablet 4  . Polyethyl Glycol-Propyl Glycol (LUBRICANT EYE DROPS) 0.4-0.3 % SOLN Place 1 drop into both eyes 3 (three) times daily as needed (dry/irritated eyes.).    Marland Kitchen rivaroxaban (XARELTO) 20 MG TABS tablet Take 1 tablet (20 mg total) by mouth daily with supper. 180 tablet 3   No current facility-administered medications for this visit.      Past Medical History:  Diagnosis Date  . Acute myocardial infarction of other anterior wall, episode of care unspecified   . Atrial fibrillation (HCC)   . Coronary atherosclerosis of native coronary artery    diagnosed 12/10    . ICD (implantable cardiac defibrillator) in place   . Ischemic cardiomyopathy   . 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   Past Surgical History:  Procedure Laterality Date  . BACK SURGERY    . CARDIAC DEFIBRILLATOR PLACEMENT     St Jude  . ICD GENERATOR CHANGEOUT N/A 03/15/2018   Procedure: ICD GENERATOR CHANGEOUT;  Surgeon: Marinus Maw, MD;  Location: [facility] INVASIVE CV LAB;  Service: Cardiovascular;  Laterality: N/A;  . KNEE SURGERY    . LEAD INSERTION N/A 03/15/2018   Procedure: LEAD INSERTION;  Surgeon: Marinus Maw, MD;  Location: [facility] INVASIVE CV LAB;  Service: Cardiovascular;  Laterality: N/A;  . SHOULDER SURGERY    . VASECTOMY       Family History  Problem Relation Age of Onset  . Coronary artery disease Mother        Cabd  . Diabetes Mother   . Heart attack Father        died 55     Social History   Socioeconomic History  . Marital status: Married    Spouse name: Not on file  . Number of children: 2  . Years of education: Not on file  . Highest education level: Not on file  Occupational History  .  Not on file  Social Needs  . Financial resource strain: Not on file  . Food insecurity    Worry: Not on file    Inability: Not on file  . Transportation needs    Medical: Not on file    Non-medical: Not on file  Tobacco Use  . Smoking status: Former Smoker    Quit date: 01/03/2009    Years since quitting: 9.5  . Smokeless tobacco: Never Used  Substance and Sexual Activity  . Alcohol use: No  . Drug use: No  . Sexual activity: Not on file  Lifestyle  . Physical activity    Days per week: Not on file    Minutes per session: Not on file  . Stress: Not on file  Relationships  . Social Herbalist on phone: Not on file    Gets together: Not on file    Attends religious service: Not on file    Active member of club or organization: Not on file     Attends meetings of clubs or organizations: Not on file    Relationship status: Not on file  . Intimate partner violence    Fear of current or ex partner: Not on file    Emotionally abused: Not on file    Physically abused: Not on file    Forced sexual activity: Not on file  Other Topics Concern  . Not on file  Social History Narrative  . Not on file     BP 120/82   Pulse 64   Ht 5\' 11"  (1.803 m)   Wt 218 lb (98.9 kg)   SpO2 98%   BMI 30.40 kg/m   Physical Exam:  Well appearing NAD HEENT: Unremarkable Neck:  No JVD, no thyromegally Lymphatics:  No adenopathy Back:  No CVA tenderness Lungs:  Clear with no wheezes HEART:  Regular rate rhythm, no murmurs, no rubs, no clicks Abd:  soft, positive bowel sounds, no organomegally, no rebound, no guarding Ext:  2 plus pulses, no edema, no cyanosis, no clubbing Skin:  No rashes no nodules Neuro:  CN II through XII intact, motor grossly intact  EKG -NSR with PVC's  DEVICE  Normal device function.  See PaceArt for details.   Assess/Plan: 1. Chronic systolic heart failure - his symptoms are class 2. He denies chest pain or sob. He has not had edema.  2. ICD - he denies ICD shock. His incision has healed well. 3. CAD - he remains active and has had no chest pain.   Mikle Bosworth.D.

## 2018-07-27 NOTE — Patient Instructions (Addendum)
Medication Instructions:  Your physician recommends that you continue on your current medications as directed. Please refer to the Current Medication list given to you today.  Labwork: None ordered.  Testing/Procedures: None ordered.  Follow-Up: Your physician wants you to follow-up in: 9 months with Dr. Lovena Le.   You will receive a reminder letter in the mail two months in advance. If you don't receive a letter, please call our office to schedule the follow-up appointment.  Remote monitoring is used to monitor your ICD from home. This monitoring reduces the number of office visits required to check your device to one time per year. It allows [place] to keep an eye on the functioning of your device to ensure it is working properly. You are scheduled for a device check from home on 10/08/2018. You may send your transmission at any time that day. If you have a wireless device, the transmission will be sent automatically. After your physician reviews your transmission, you will receive a postcard with your next transmission date.  Any Other Special Instructions Will Be Listed Below (If Applicable).  If you need a refill on your cardiac medications before your next appointment, please call your pharmacy.

## 2018-09-16 DIAGNOSIS — I4891 Unspecified atrial fibrillation: Secondary | ICD-10-CM | POA: Diagnosis not present

## 2018-09-16 DIAGNOSIS — L03115 Cellulitis of right lower limb: Secondary | ICD-10-CM | POA: Diagnosis not present

## 2018-10-08 ENCOUNTER — Ambulatory Visit (INDEPENDENT_AMBULATORY_CARE_PROVIDER_SITE_OTHER): Payer: PPO | Admitting: *Deleted

## 2018-10-08 DIAGNOSIS — I255 Ischemic cardiomyopathy: Secondary | ICD-10-CM | POA: Diagnosis not present

## 2018-10-08 LAB — CUP PACEART REMOTE DEVICE CHECK
Battery Remaining Longevity: 83 mo
Battery Remaining Percentage: 87 %
Battery Voltage: 3.05 V
Brady Statistic RV Percent Paced: 1 %
Date Time Interrogation Session: 20200903074105
HighPow Impedance: 51 Ohm
HighPow Impedance: 51 Ohm
Implantable Lead Implant Date: 20200207
Implantable Lead Location: 753860
Implantable Lead Model: 7121
Implantable Pulse Generator Implant Date: 20200207
Lead Channel Impedance Value: 400 Ohm
Lead Channel Pacing Threshold Amplitude: 1 V
Lead Channel Pacing Threshold Pulse Width: 0.5 ms
Lead Channel Sensing Intrinsic Amplitude: 9.5 mV
Lead Channel Setting Pacing Amplitude: 3.5 V
Lead Channel Setting Pacing Pulse Width: 0.5 ms
Lead Channel Setting Sensing Sensitivity: 0.5 mV
Pulse Gen Serial Number: 9793547

## 2018-10-20 ENCOUNTER — Encounter: Payer: Self-pay | Admitting: Cardiology

## 2018-10-20 NOTE — Progress Notes (Signed)
Remote ICD transmission.   

## 2018-10-25 DIAGNOSIS — Z23 Encounter for immunization: Secondary | ICD-10-CM | POA: Diagnosis not present

## 2019-01-07 ENCOUNTER — Ambulatory Visit (INDEPENDENT_AMBULATORY_CARE_PROVIDER_SITE_OTHER): Payer: PPO | Admitting: *Deleted

## 2019-01-07 DIAGNOSIS — Z95810 Presence of automatic (implantable) cardiac defibrillator: Secondary | ICD-10-CM

## 2019-01-08 LAB — CUP PACEART REMOTE DEVICE CHECK
Battery Remaining Longevity: 82 mo
Battery Remaining Percentage: 85 %
Battery Voltage: 3.02 V
Brady Statistic RV Percent Paced: 1 %
Date Time Interrogation Session: 20201204031731
HighPow Impedance: 56 Ohm
HighPow Impedance: 56 Ohm
Implantable Lead Implant Date: 20200207
Implantable Lead Location: 753860
Implantable Lead Model: 7121
Implantable Pulse Generator Implant Date: 20200207
Lead Channel Impedance Value: 410 Ohm
Lead Channel Pacing Threshold Amplitude: 1 V
Lead Channel Pacing Threshold Pulse Width: 0.5 ms
Lead Channel Sensing Intrinsic Amplitude: 11.5 mV
Lead Channel Setting Pacing Amplitude: 3.5 V
Lead Channel Setting Pacing Pulse Width: 0.5 ms
Lead Channel Setting Sensing Sensitivity: 0.5 mV
Pulse Gen Serial Number: 9793547

## 2019-01-19 ENCOUNTER — Other Ambulatory Visit: Payer: Self-pay | Admitting: Cardiovascular Disease

## 2019-02-14 ENCOUNTER — Ambulatory Visit: Payer: PPO | Admitting: Cardiovascular Disease

## 2019-02-15 NOTE — Progress Notes (Signed)
Chief Complaint  Patient presents with  . Follow-up    CAD   History of Present Illness: 64 yo male with history of former tobacco abuse, CAD, ischemic cardiomyopathy and paroxysmal atrial fibrillation who is here today for cardiac follow up. He was admitted to [facility] in December 2010 with an anterior ST elevation MI with thrombosis of the mid LAD stent placed in 2005. I placed a drug eluting stent in the mid LAD. After his MI, his hospitalization was complicated by atrial fibrillation. He was also found to have severe hypokinesis of the anterior wall on echo and a possible LV thrombus. He has been anti-coagulated to limit the risk of LV thrombus given the fact that his anterior wall is akinetic. Cardiac MRI in the spring of 2011 with EF of 35% and full thickness scar in the apex, distal septum and inferoapex.He has been seen by Dr. Lovena Le and an ICD was placed in August 2011 given his risk profile for sudden cardiac death. He has been followed in our device clinic by Dr. Lovena Le. ICD generator change February 2020. Echo January 2019 with LVEF=45-50%. Contrast in apex. Akinesis of the apical lateral, septal, inferolateral and inferior myocardium. Grade 1 diastolic dysfunction.   He is here today for follow up. The patient denies any chest pain, dyspnea, palpitations, lower extremity edema, orthopnea, PND, dizziness, near syncope or syncope.   Primary Care Physician: Raina Mina., MD  Past Medical History:  Diagnosis Date  . Acute myocardial infarction of other anterior wall, episode of care unspecified   . Atrial fibrillation ([place])   . Coronary atherosclerosis of native coronary artery    diagnosed 12/10   . ICD (implantable cardiac defibrillator) in place   . Ischemic cardiomyopathy   . LV (left ventricular) mural thrombus following MI [facility])     Past Surgical History:  Procedure Laterality Date  . BACK SURGERY    . CARDIAC DEFIBRILLATOR PLACEMENT     St Jude  . ICD GENERATOR  CHANGEOUT N/A 03/15/2018   Procedure: ICD GENERATOR CHANGEOUT;  Surgeon: Evans Lance, MD;  Location: [place] CV LAB;  Service: Cardiovascular;  Laterality: N/A;  . KNEE SURGERY    . LEAD INSERTION N/A 03/15/2018   Procedure: LEAD INSERTION;  Surgeon: Evans Lance, MD;  Location: [place] CV LAB;  Service: Cardiovascular;  Laterality: N/A;  . SHOULDER SURGERY    . VASECTOMY      Current Outpatient Medications  Medication Sig Dispense Refill  . acetaminophen (TYLENOL) 500 MG tablet Take 500 mg by mouth every 6 (six) hours as needed (pain.).     Marland Kitchen aspirin EC 81 MG tablet Take 1 tablet (81 mg total) by mouth daily. 90 tablet 3  . atorvastatin (LIPITOR) 80 MG tablet Take 1 tablet (80 mg total) by mouth daily. 30 tablet 11  . lisinopril (ZESTRIL) 10 MG tablet TAKE 1 TABLET BY MOUTH ONCE DAILY 90 tablet 3  . metoprolol tartrate (LOPRESSOR) 50 MG tablet TAKE ONE TABLET BY MOUTH 2 TIMES A DAY 180 tablet 3  . nitroGLYCERIN (NITROSTAT) 0.4 MG SL tablet Place 1 tablet (0.4 mg total) under the tongue every 5 (five) minutes as needed for chest pain (MAX 3 TABLETS). 25 tablet 4  . omeprazole (PRILOSEC) 40 MG capsule Take 40 mg by mouth as needed.    Vladimir Faster Glycol-Propyl Glycol (LUBRICANT EYE DROPS) 0.4-0.3 % SOLN Place 1 drop into both eyes 3 (three) times daily as needed (dry/irritated eyes.).    Marland Kitchen  rivaroxaban (XARELTO) 20 MG TABS tablet Take 1 tablet (20 mg total) by mouth daily with supper. 180 tablet 3   No current facility-administered medications for this visit.    No Known Allergies  Social History   Socioeconomic History  . Marital status: Married    Spouse name: Not on file  . Number of children: 2  . Years of education: Not on file  . Highest education level: Not on file  Occupational History  . Not on file  Tobacco Use  . Smoking status: Former Smoker    Quit date: 01/03/2009    Years since quitting: 10.1  . Smokeless tobacco: Never Used  Substance and Sexual  Activity  . Alcohol use: No  . Drug use: No  . Sexual activity: Not on file  Other Topics Concern  . Not on file  Social History Narrative  . Not on file   Social Determinants of Health   Financial Resource Strain:   . Difficulty of Paying Living Expenses: Not on file  Food Insecurity:   . Worried About Programme researcher, broadcasting/film/video in the Last Year: Not on file  . Ran Out of Food in the Last Year: Not on file  Transportation Needs:   . Lack of Transportation (Medical): Not on file  . Lack of Transportation (Non-Medical): Not on file  Physical Activity:   . Days of Exercise per Week: Not on file  . Minutes of Exercise per Session: Not on file  Stress:   . Feeling of Stress : Not on file  Social Connections:   . Frequency of Communication with Friends and Family: Not on file  . Frequency of Social Gatherings with Friends and Family: Not on file  . Attends Religious Services: Not on file  . Active Member of Clubs or Organizations: Not on file  . Attends Banker Meetings: Not on file  . Marital Status: Not on file  Intimate Partner Violence:   . Fear of Current or Ex-Partner: Not on file  . Emotionally Abused: Not on file  . Physically Abused: Not on file  . Sexually Abused: Not on file    Family History  Problem Relation Age of Onset  . Coronary artery disease Mother        Cabd  . Diabetes Mother   . Heart attack Father        died 2    Review of Systems:  As stated in the HPI and otherwise negative.   BP 124/68   Pulse 63   Ht 5\' 11"  (1.803 m)   Wt 226 lb (102.5 kg)   SpO2 98%   BMI 31.52 kg/m   Physical Examination: General: Well developed, well nourished, NAD  HEENT: OP clear, mucus membranes moist  SKIN: warm, dry. No rashes. Neuro: No focal deficits  Musculoskeletal: Muscle strength 5/5 all ext  Psychiatric: Mood and affect normal  Neck: No JVD, no carotid bruits, no thyromegaly, no lymphadenopathy.  Lungs:Clear bilaterally, no wheezes,  rhonci, crackles Cardiovascular: Regular rate and rhythm. No murmurs, gallops or rubs. Abdomen:Soft. Bowel sounds present. Non-tender.  Extremities: No lower extremity edema. Pulses are 2 + in the bilateral DP/PT.  Echo January 2019: - Left ventricle: The inferoapical region of the LV is aneursymal   with significant swirling of definity contrast in the apex which   is consistent with sluggish blood flow and cannot rule out early   forming thrombus. The cavity size was normal. Systolic function   was mildly  reduced. The estimated ejection fraction was in the   range of 45% to 50%. There is akinesis of the apical myocardium.   There is akinesis of the apical lateral, septal, inferolateral,   and inferior myocardium. There is severe hypokinesis of the   apicalanterior myocardium. There was an increased relative   contribution of atrial contraction to ventricular filling.   Doppler parameters are consistent with abnormal left ventricular   relaxation (grade 1 diastolic dysfunction). - Left atrium: The atrium was mildly dilated.   EKG:  EKG is not ordered today. The ekg ordered today demonstrates   Recent Labs: 03/12/2018: BUN 12; Creatinine, Ser 0.90; Hemoglobin 16.6; Platelets 290; Potassium 4.8; Sodium 139   Lipid Panel Lipid Panel     Component Value Date/Time   CHOL 167 03/30/2018 0824   TRIG 263 (H) 03/30/2018 0824   HDL 35 (L) 03/30/2018 0824   CHOLHDL 4.8 03/30/2018 0824   CHOLHDL 4.5 10/19/2015 0941   VLDL 50 (H) 10/19/2015 0941   LDLCALC 79 03/30/2018 0824   LDLDIRECT 113.4 01/07/2011 1021     Wt Readings from Last 3 Encounters:  02/16/19 226 lb (102.5 kg)  07/27/18 218 lb (98.9 kg)  03/16/18 219 lb 2.2 oz (99.4 kg)     Other studies Reviewed: Additional studies/ records that were reviewed today include: . Review of the above records demonstrates:    Assessment and Plan:   1. CAD without angina: He has no chest pain. Continue ASA, statin and beta blocker.    2. Ischemic Cardiomyopathy: ICD in place and followed by Dr. Ladona Ridgel. LVEF=45-50% by echo January 2019 with apical akinesis.  Continue beta blocker and Ace-inh.   3. MURAL THROMBUS, APEX OF LV : Sluggish flow seen in apex on echo January 2019. Will continue Xarelto for prevention of LV thrombus given apical akinesis.    4. HTN: BP is controlled. No changes today  5. HYPERLIPIDEMIA:  LDL near goal in February 9381. Repeat lipids and LFTs today. Continue statin  Current medicines are reviewed at length with the patient today.  The patient does not have concerns regarding medicines.  The following changes have been made:  no change  Labs/ tests ordered today include:   No orders of the defined types were placed in this encounter.   Disposition:   FU with me in 12  months  Signed, Verne Carrow, MD 02/16/2019 10:02 AM    [facility] [address] [place], [place], [place]  [address] Phone: [phone]; Fax: [phone]

## 2019-02-16 ENCOUNTER — Ambulatory Visit: Payer: PPO | Admitting: Cardiovascular Disease

## 2019-02-16 ENCOUNTER — Encounter: Payer: Self-pay | Admitting: Cardiovascular Disease

## 2019-02-16 ENCOUNTER — Other Ambulatory Visit: Payer: PPO | Admitting: *Deleted

## 2019-02-16 ENCOUNTER — Other Ambulatory Visit: Payer: Self-pay

## 2019-02-16 ENCOUNTER — Other Ambulatory Visit: Payer: Self-pay | Admitting: Internal Medicine

## 2019-02-16 VITALS — BP 124/68 | HR 63 | Ht 71.0 in | Wt 226.0 lb

## 2019-02-16 DIAGNOSIS — I10 Essential (primary) hypertension: Secondary | ICD-10-CM | POA: Diagnosis not present

## 2019-02-16 DIAGNOSIS — I2510 Atherosclerotic heart disease of native coronary artery without angina pectoris: Secondary | ICD-10-CM

## 2019-02-16 DIAGNOSIS — I255 Ischemic cardiomyopathy: Secondary | ICD-10-CM | POA: Diagnosis not present

## 2019-02-16 DIAGNOSIS — E7800 Pure hypercholesterolemia, unspecified: Secondary | ICD-10-CM | POA: Diagnosis not present

## 2019-02-16 DIAGNOSIS — I2129 ST elevation (STEMI) myocardial infarction involving other sites: Secondary | ICD-10-CM

## 2019-02-16 LAB — CBC WITH DIFFERENTIAL/PLATELET
Basophils Absolute: 0.1 10*3/uL (ref 0.0–0.2)
Basos: 1 %
EOS (ABSOLUTE): 0.1 10*3/uL (ref 0.0–0.4)
Eos: 2 %
Hematocrit: 47.6 % (ref 37.5–51.0)
Hemoglobin: 16.7 g/dL (ref 13.0–17.7)
Immature Grans (Abs): 0.1 10*3/uL (ref 0.0–0.1)
Immature Granulocytes: 1 %
Lymphocytes Absolute: 2.2 10*3/uL (ref 0.7–3.1)
Lymphs: 29 %
MCH: 30.8 pg (ref 26.6–33.0)
MCHC: 35.1 g/dL (ref 31.5–35.7)
MCV: 88 fL (ref 79–97)
Monocytes Absolute: 0.6 10*3/uL (ref 0.1–0.9)
Monocytes: 8 %
Neutrophils Absolute: 4.4 10*3/uL (ref 1.4–7.0)
Neutrophils: 59 %
Platelets: 256 10*3/uL (ref 150–450)
RBC: 5.42 x10E6/uL (ref 4.14–5.80)
RDW: 12.9 % (ref 11.6–15.4)
WBC: 7.4 10*3/uL (ref 3.4–10.8)

## 2019-02-16 LAB — BASIC METABOLIC PANEL
BUN/Creatinine Ratio: 13 (ref 10–24)
BUN: 11 mg/dL (ref 8–27)
CO2: 22 mmol/L (ref 20–29)
Calcium: 9.9 mg/dL (ref 8.6–10.2)
Chloride: 101 mmol/L (ref 96–106)
Creatinine, Ser: 0.84 mg/dL (ref 0.76–1.27)
GFR calc Af Amer: 107 mL/min/{1.73_m2} (ref >59)
GFR calc non Af Amer: 93 mL/min/{1.73_m2} (ref >59)
Glucose: 98 mg/dL (ref 65–99)
Potassium: 5 mmol/L (ref 3.5–5.2)
Sodium: 138 mmol/L (ref 134–144)

## 2019-02-16 LAB — LIPID PANEL
Chol/HDL Ratio: 4.4 ratio (ref 0.0–5.0)
Cholesterol, Total: 153 mg/dL (ref 100–199)
HDL: 35 mg/dL — ABNORMAL LOW (ref >39)
LDL Chol Calc (NIH): 87 mg/dL (ref 0–99)
Triglycerides: 183 mg/dL — ABNORMAL HIGH (ref 0–149)
VLDL Cholesterol Cal: 31 mg/dL (ref 5–40)

## 2019-02-16 LAB — HEPATIC FUNCTION PANEL
ALT: 73 IU/L — ABNORMAL HIGH (ref 0–44)
AST: 43 IU/L — ABNORMAL HIGH (ref 0–40)
Albumin: 4.5 g/dL (ref 3.8–4.8)
Alkaline Phosphatase: 83 IU/L (ref 39–117)
Bilirubin Total: 0.6 mg/dL (ref 0.0–1.2)
Bilirubin, Direct: 0.16 mg/dL (ref 0.00–0.40)
Total Protein: 6.8 g/dL (ref 6.0–8.5)

## 2019-02-16 NOTE — Patient Instructions (Signed)

## 2019-02-17 ENCOUNTER — Telehealth: Payer: Self-pay

## 2019-02-17 NOTE — Telephone Encounter (Signed)
The patient has been notified of the result and verbalized understanding.  All questions (if any) were answered. Leanord Hawking, RN 02/17/2019 1:17 PM

## 2019-02-17 NOTE — Telephone Encounter (Signed)
-----   Message from Kathleene Hazel, MD sent at 02/16/2019  4:53 PM EST ----- LDL slightly above goal. I would have him keep taking the Lipitor every day. Focus on dietary changes and exercise. LFTs are always mildly abnormal. No changes. cdm

## 2019-02-21 ENCOUNTER — Other Ambulatory Visit: Payer: Self-pay | Admitting: Cardiovascular Disease

## 2019-02-28 DIAGNOSIS — K219 Gastro-esophageal reflux disease without esophagitis: Secondary | ICD-10-CM | POA: Diagnosis not present

## 2019-02-28 DIAGNOSIS — Z0000 Encounter for general adult medical examination without abnormal findings: Secondary | ICD-10-CM | POA: Diagnosis not present

## 2019-02-28 DIAGNOSIS — Z125 Encounter for screening for malignant neoplasm of prostate: Secondary | ICD-10-CM | POA: Diagnosis not present

## 2019-02-28 DIAGNOSIS — M1990 Unspecified osteoarthritis, unspecified site: Secondary | ICD-10-CM | POA: Diagnosis not present

## 2019-02-28 DIAGNOSIS — I10 Essential (primary) hypertension: Secondary | ICD-10-CM | POA: Diagnosis not present

## 2019-02-28 DIAGNOSIS — I2510 Atherosclerotic heart disease of native coronary artery without angina pectoris: Secondary | ICD-10-CM | POA: Diagnosis not present

## 2019-02-28 DIAGNOSIS — I4821 Permanent atrial fibrillation: Secondary | ICD-10-CM | POA: Diagnosis not present

## 2019-02-28 DIAGNOSIS — R7303 Prediabetes: Secondary | ICD-10-CM | POA: Diagnosis not present

## 2019-02-28 DIAGNOSIS — E785 Hyperlipidemia, unspecified: Secondary | ICD-10-CM | POA: Diagnosis not present

## 2019-02-28 DIAGNOSIS — E782 Mixed hyperlipidemia: Secondary | ICD-10-CM | POA: Diagnosis not present

## 2019-02-28 DIAGNOSIS — Z7289 Other problems related to lifestyle: Secondary | ICD-10-CM | POA: Diagnosis not present

## 2019-02-28 DIAGNOSIS — R945 Abnormal results of liver function studies: Secondary | ICD-10-CM | POA: Diagnosis not present

## 2019-03-08 DIAGNOSIS — R945 Abnormal results of liver function studies: Secondary | ICD-10-CM | POA: Diagnosis not present

## 2019-03-08 DIAGNOSIS — K760 Fatty (change of) liver, not elsewhere classified: Secondary | ICD-10-CM | POA: Diagnosis not present

## 2019-03-26 ENCOUNTER — Other Ambulatory Visit: Payer: Self-pay | Admitting: Cardiovascular Disease

## 2019-04-08 ENCOUNTER — Ambulatory Visit (INDEPENDENT_AMBULATORY_CARE_PROVIDER_SITE_OTHER): Payer: PPO | Admitting: *Deleted

## 2019-04-08 DIAGNOSIS — Z95810 Presence of automatic (implantable) cardiac defibrillator: Secondary | ICD-10-CM | POA: Diagnosis not present

## 2019-04-08 LAB — CUP PACEART REMOTE DEVICE CHECK
Battery Remaining Longevity: 80 mo
Battery Remaining Percentage: 83 %
Battery Voltage: 3.01 V
Brady Statistic RV Percent Paced: 1 %
Date Time Interrogation Session: 20210305020015
HighPow Impedance: 57 Ohm
HighPow Impedance: 57 Ohm
Implantable Lead Implant Date: 20200207
Implantable Lead Location: 753860
Implantable Lead Model: 7121
Implantable Pulse Generator Implant Date: 20200207
Lead Channel Impedance Value: 430 Ohm
Lead Channel Pacing Threshold Amplitude: 1 V
Lead Channel Pacing Threshold Pulse Width: 0.5 ms
Lead Channel Sensing Intrinsic Amplitude: 10.2 mV
Lead Channel Setting Pacing Amplitude: 3.5 V
Lead Channel Setting Pacing Pulse Width: 0.5 ms
Lead Channel Setting Sensing Sensitivity: 0.5 mV
Pulse Gen Serial Number: 9793547

## 2019-04-09 NOTE — Progress Notes (Signed)
ICD remote 

## 2019-05-04 ENCOUNTER — Other Ambulatory Visit: Payer: Self-pay | Admitting: Internal Medicine

## 2019-05-06 ENCOUNTER — Ambulatory Visit: Payer: PPO | Admitting: Internal Medicine

## 2019-05-06 ENCOUNTER — Encounter (INDEPENDENT_AMBULATORY_CARE_PROVIDER_SITE_OTHER): Payer: Self-pay

## 2019-05-06 ENCOUNTER — Encounter: Payer: Self-pay | Admitting: Internal Medicine

## 2019-05-06 ENCOUNTER — Other Ambulatory Visit: Payer: Self-pay

## 2019-05-06 VITALS — BP 120/76 | HR 54 | Ht 71.0 in | Wt 223.0 lb

## 2019-05-06 DIAGNOSIS — I255 Ischemic cardiomyopathy: Secondary | ICD-10-CM | POA: Diagnosis not present

## 2019-05-06 DIAGNOSIS — Z95810 Presence of automatic (implantable) cardiac defibrillator: Secondary | ICD-10-CM

## 2019-05-06 DIAGNOSIS — I5022 Chronic systolic (congestive) heart failure: Secondary | ICD-10-CM

## 2019-05-06 LAB — CUP PACEART INCLINIC DEVICE CHECK
Battery Remaining Longevity: 86 mo
Brady Statistic RV Percent Paced: 0.03 %
Date Time Interrogation Session: 20210402124100
HighPow Impedance: 52.9675
Implantable Lead Implant Date: 20200207
Implantable Lead Location: 753860
Implantable Lead Model: 7121
Implantable Pulse Generator Implant Date: 20200207
Lead Channel Impedance Value: 437.5 Ohm
Lead Channel Pacing Threshold Amplitude: 0.75 V
Lead Channel Pacing Threshold Pulse Width: 0.5 ms
Lead Channel Sensing Intrinsic Amplitude: 9.7 mV
Lead Channel Setting Pacing Amplitude: 2.5 V
Lead Channel Setting Pacing Pulse Width: 0.5 ms
Lead Channel Setting Sensing Sensitivity: 0.5 mV
Pulse Gen Serial Number: 9793547

## 2019-05-06 NOTE — Progress Notes (Signed)
HPI [name] returns today for ongoing evaluation and management of his ICD and chronic systolic heart failure. He underwent ICD generator change out over a year ago. He initially had pain after his initial ICD implant but has had none since. He denies chest pain or sob. No edema. No ICD therapies. No Known Allergies   Current Outpatient Medications  Medication Sig Dispense Refill  . acetaminophen (TYLENOL) 500 MG tablet Take 500 mg by mouth every 6 (six) hours as needed (pain.).     Marland Kitchen aspirin EC 81 MG tablet Take 1 tablet (81 mg total) by mouth daily. 90 tablet 3  . atorvastatin (LIPITOR) 80 MG tablet TAKE 1 TABLET ONCE DAILY 30 tablet 11  . lisinopril (ZESTRIL) 10 MG tablet TAKE 1 TABLET BY MOUTH ONCE DAILY 90 tablet 3  . metoprolol tartrate (LOPRESSOR) 50 MG tablet TAKE ONE TABLET BY MOUTH 2 TIMES A DAY 180 tablet 3  . nitroGLYCERIN (NITROSTAT) 0.4 MG SL tablet Place 1 tablet (0.4 mg total) under the tongue every 5 (five) minutes as needed for chest pain (MAX 3 TABLETS). 25 tablet 4  . omeprazole (PRILOSEC) 40 MG capsule Take 40 mg by mouth as needed.    Jeremy Boyle Glycol-Propyl Glycol (LUBRICANT EYE DROPS) 0.4-0.3 % SOLN Place 1 drop into both eyes 3 (three) times daily as needed (dry/irritated eyes.).    Marland Kitchen XARELTO 20 MG TABS tablet TAKE ONE TABLET BY MOUTH EVERY DAY WITH SUPPER 90 tablet 3   No current facility-administered medications for this visit.     Past Medical History:  Diagnosis Date  . Acute myocardial infarction of other anterior wall, episode of care unspecified   . Atrial fibrillation (HCC)   . Automatic implantable cardioverter-defibrillator in situ 01/01/2010   Qualifier: Diagnosis of  By: Jeremy Ridgel, MD, Jeremy Boyle   . CARDIOMYOPATHY, ISCHEMIC 02/06/2009   Qualifier: Diagnosis of  By: Jeremy Boyle    . Cardiomyopathy, ischemic 07/27/2018  . Chronic systolic heart failure (HCC) 03/15/2018  . Coronary atherosclerosis of native coronary artery      diagnosed 12/10   . Failure of implantable cardioverter-defibrillator (ICD) lead 03/15/2018  . HTN (hypertension) 01/06/2011  . HYPERLIPIDEMIA 02/06/2009   Qualifier: Diagnosis of  By: Jeremy Boyle    . ICD (implantable cardiac defibrillator) in place   . Implantable cardioverter-defibrillator (ICD) generator end of life 03/15/2018  . Ischemic cardiomyopathy   . 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 MURAL THROMBUS, APEX OF HEART 02/07/2009   Qualifier: Diagnosis of  By: Jeremy James, MD, Jeremy Boyle      ROS:   All systems reviewed and negative except as noted in the HPI.   Past Surgical History:  Procedure Laterality Date  . BACK SURGERY    . CARDIAC DEFIBRILLATOR PLACEMENT     St Jude  . ICD GENERATOR CHANGEOUT N/A 03/15/2018   Procedure: ICD GENERATOR CHANGEOUT;  Surgeon: Jeremy Maw, MD;  Location: [facility] INVASIVE CV LAB;  Service: Cardiovascular;  Laterality: N/A;  . KNEE SURGERY    . LEAD INSERTION N/A 03/15/2018   Procedure: LEAD INSERTION;  Surgeon: Jeremy Maw, MD;  Location: [facility] INVASIVE CV LAB;  Service: Cardiovascular;  Laterality: N/A;  . SHOULDER SURGERY    . VASECTOMY       Family History  Problem Relation Age of Onset  . Coronary artery disease Mother        Cabd  . Diabetes Mother   .  Heart attack Father        died 64     Social History   Socioeconomic History  . Marital status: Married    Spouse name: Not on file  . Number of children: 2  . Years of education: Not on file  . Highest education level: Not on file  Occupational History  . Not on file  Tobacco Use  . Smoking status: Former Smoker    Quit date: 01/03/2009    Years since quitting: 10.3  . Smokeless tobacco: Never Used  Substance and Sexual Activity  . Alcohol use: No  . Drug use: No  . Sexual activity: Not on file  Other Topics Concern  . Not on file  Social History Narrative  . Not on file   Social Determinants of Health   Financial Resource  Strain:   . Difficulty of Paying Living Expenses:   Food Insecurity:   . Worried About Charity fundraiser in the Last Year:   . Arboriculturist in the Last Year:   Transportation Needs:   . Film/video editor (Medical):   Marland Kitchen Lack of Transportation (Non-Medical):   Physical Activity:   . Days of Exercise per Week:   . Minutes of Exercise per Session:   Stress:   . Feeling of Stress :   Social Connections:   . Frequency of Communication with Friends and Family:   . Frequency of Social Gatherings with Friends and Family:   . Attends Religious Services:   . Active Member of Clubs or Organizations:   . Attends Archivist Meetings:   Marland Kitchen Marital Status:   Intimate Partner Violence:   . Fear of Current or Ex-Partner:   . Emotionally Abused:   Marland Kitchen Physically Abused:   . Sexually Abused:      BP 120/76   Pulse (!) 54   Ht 5\' 11"  (1.803 m)   Wt 223 lb (101.2 kg)   SpO2 97%   BMI 31.10 kg/m   Physical Exam:  Well appearing NAD HEENT: Unremarkable Neck:  No JVD, no thyromegally Lymphatics:  No adenopathy Back:  No CVA tenderness Lungs:  Clear HEART:  Regular rate rhythm, no murmurs, no rubs, no clicks Abd:  soft, positive bowel sounds, no organomegally, no rebound, no guarding Ext:  2 plus pulses, no edema, no cyanosis, no clubbing Skin:  No rashes no nodules Neuro:  CN II through XII intact, motor grossly intact  EKG - NSR  DEVICE  Normal device function.  See PaceArt for details.   Assess/Plan: 1. Chronic systolic heart failure - his symptoms remain class 2. He will continue his current meds. 2. ICD - his St. Jude device was revised over a year ago. His lead is working normally. 3. CAD - he is s/p MI. He denies anginal symptoms. We will follow.  Jeremy Boyle.D.

## 2019-05-06 NOTE — Patient Instructions (Signed)

## 2019-07-08 ENCOUNTER — Ambulatory Visit (INDEPENDENT_AMBULATORY_CARE_PROVIDER_SITE_OTHER): Payer: PPO | Admitting: *Deleted

## 2019-07-08 DIAGNOSIS — I255 Ischemic cardiomyopathy: Secondary | ICD-10-CM

## 2019-07-08 LAB — CUP PACEART REMOTE DEVICE CHECK
Battery Remaining Longevity: 84 mo
Battery Remaining Percentage: 81 %
Battery Voltage: 3.01 V
Brady Statistic RV Percent Paced: 1 %
Date Time Interrogation Session: 20210604020017
HighPow Impedance: 58 Ohm
HighPow Impedance: 58 Ohm
Implantable Lead Implant Date: 20200207
Implantable Lead Location: 753860
Implantable Lead Model: 7121
Implantable Pulse Generator Implant Date: 20200207
Lead Channel Impedance Value: 440 Ohm
Lead Channel Pacing Threshold Amplitude: 0.75 V
Lead Channel Pacing Threshold Pulse Width: 0.5 ms
Lead Channel Sensing Intrinsic Amplitude: 8.3 mV
Lead Channel Setting Pacing Amplitude: 2.5 V
Lead Channel Setting Pacing Pulse Width: 0.5 ms
Lead Channel Setting Sensing Sensitivity: 0.5 mV
Pulse Gen Serial Number: 9793547

## 2019-07-13 NOTE — Progress Notes (Signed)
Remote ICD transmission.   

## 2019-08-29 DIAGNOSIS — I2510 Atherosclerotic heart disease of native coronary artery without angina pectoris: Secondary | ICD-10-CM | POA: Diagnosis not present

## 2019-08-29 DIAGNOSIS — I4821 Permanent atrial fibrillation: Secondary | ICD-10-CM | POA: Diagnosis not present

## 2019-08-29 DIAGNOSIS — E782 Mixed hyperlipidemia: Secondary | ICD-10-CM | POA: Diagnosis not present

## 2019-08-29 DIAGNOSIS — K7581 Nonalcoholic steatohepatitis (NASH): Secondary | ICD-10-CM | POA: Diagnosis not present

## 2019-08-29 DIAGNOSIS — I10 Essential (primary) hypertension: Secondary | ICD-10-CM | POA: Diagnosis not present

## 2019-08-29 DIAGNOSIS — R7303 Prediabetes: Secondary | ICD-10-CM | POA: Diagnosis not present

## 2019-08-29 DIAGNOSIS — K219 Gastro-esophageal reflux disease without esophagitis: Secondary | ICD-10-CM | POA: Diagnosis not present

## 2019-10-07 ENCOUNTER — Ambulatory Visit (INDEPENDENT_AMBULATORY_CARE_PROVIDER_SITE_OTHER): Payer: PPO | Admitting: *Deleted

## 2019-10-07 DIAGNOSIS — I255 Ischemic cardiomyopathy: Secondary | ICD-10-CM | POA: Diagnosis not present

## 2019-10-09 LAB — CUP PACEART REMOTE DEVICE CHECK
Battery Remaining Longevity: 82 mo
Battery Remaining Percentage: 79 %
Battery Voltage: 2.99 V
Brady Statistic RV Percent Paced: 1 %
Date Time Interrogation Session: 20210903035712
HighPow Impedance: 55 Ohm
HighPow Impedance: 55 Ohm
Implantable Lead Implant Date: 20200207
Implantable Lead Location: 753860
Implantable Lead Model: 7121
Implantable Pulse Generator Implant Date: 20200207
Lead Channel Impedance Value: 440 Ohm
Lead Channel Pacing Threshold Amplitude: 0.75 V
Lead Channel Pacing Threshold Pulse Width: 0.5 ms
Lead Channel Sensing Intrinsic Amplitude: 7.2 mV
Lead Channel Setting Pacing Amplitude: 2.5 V
Lead Channel Setting Pacing Pulse Width: 0.5 ms
Lead Channel Setting Sensing Sensitivity: 0.5 mV
Pulse Gen Serial Number: 9793547

## 2019-10-12 NOTE — Progress Notes (Signed)
Remote ICD transmission.   

## 2019-11-22 DIAGNOSIS — Z23 Encounter for immunization: Secondary | ICD-10-CM | POA: Diagnosis not present

## 2019-11-23 IMAGING — DX DG CHEST 2V
2 series · 2 of 2 positions shown · non-contrast
Comparison: Radiographs September 12, 2015.

CLINICAL DATA: Defibrillator in place.

EXAM:
CHEST - 2 VIEW

[chest lat]
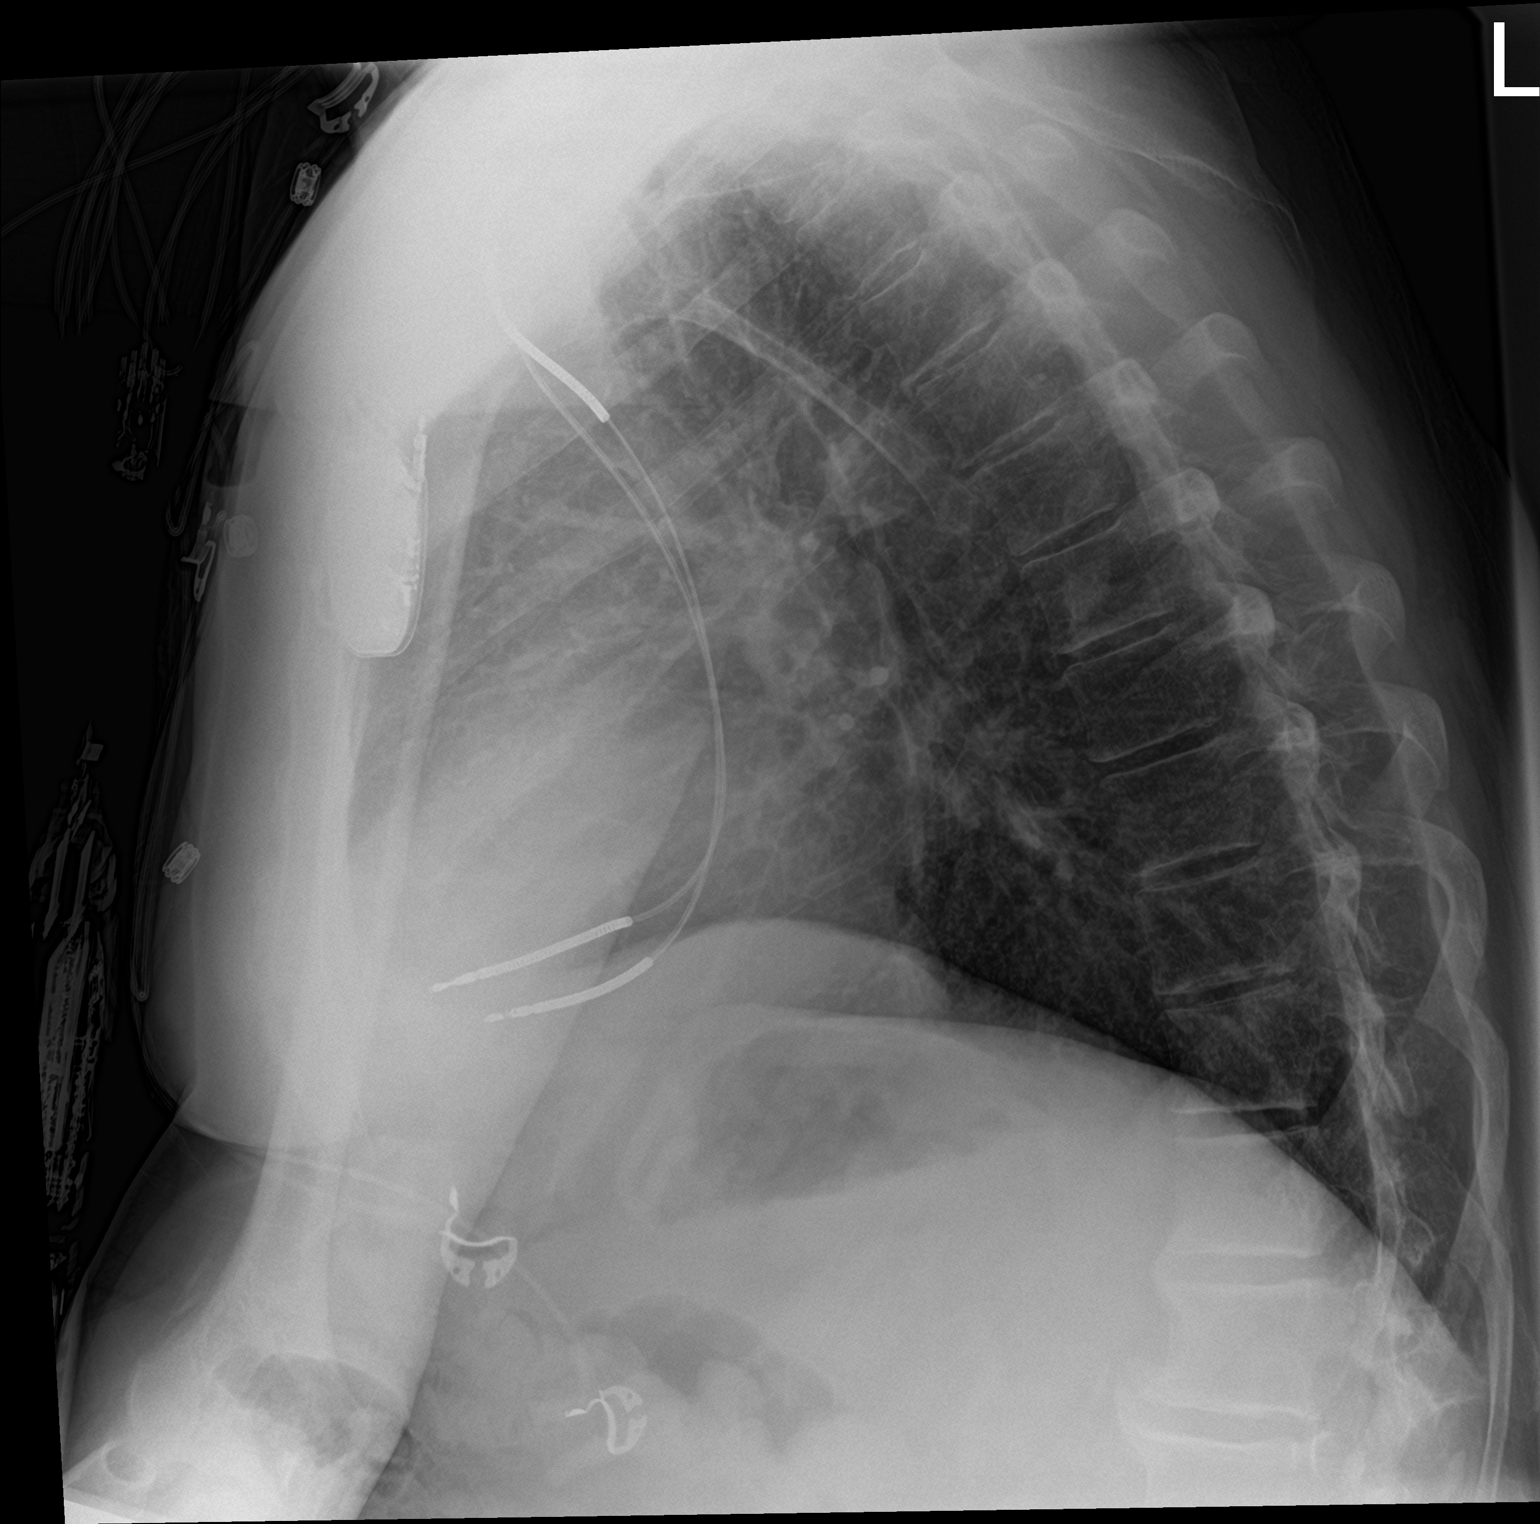

[chest ap]
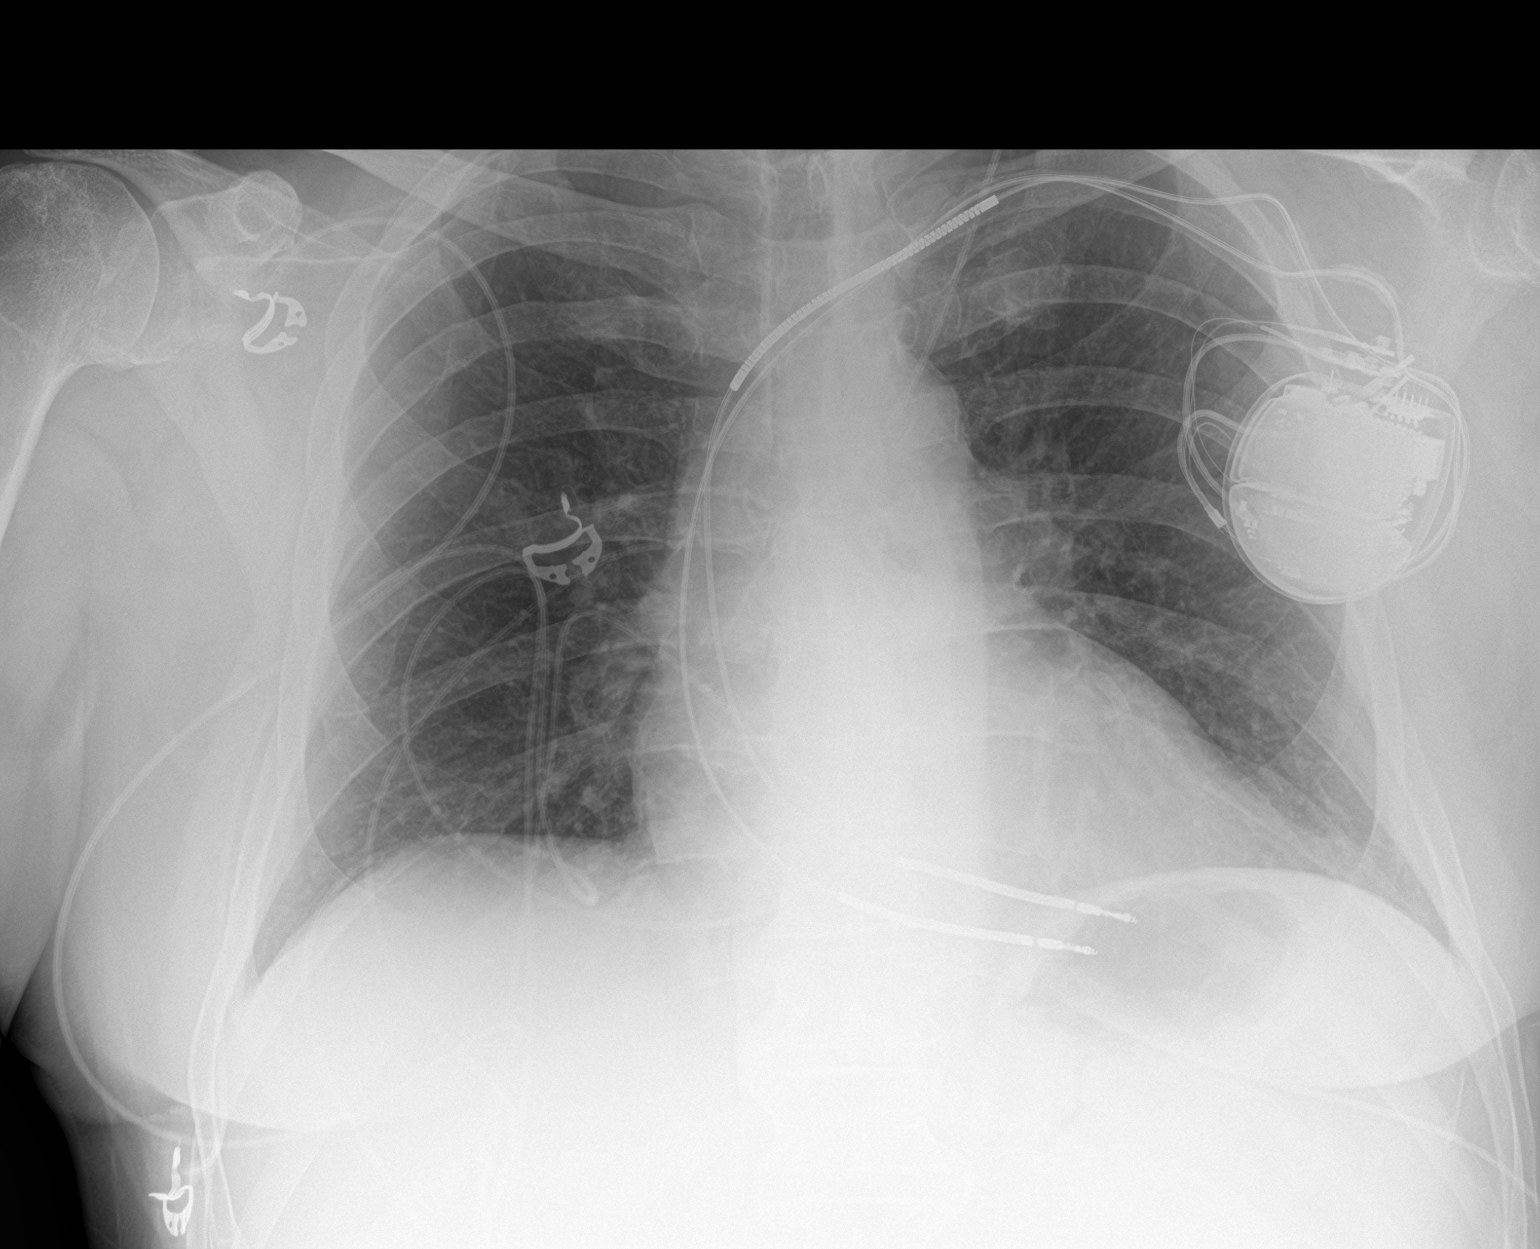

[2 of 2 positions shown; findings below may reference images not displayed]

FINDINGS: The heart size and mediastinal contours are within normal limits.
Both lungs are clear. Left-sided pacemaker is unchanged in position.
No pneumothorax or pleural effusion is noted. The visualized
skeletal structures are unremarkable.
IMPRESSION: No active cardiopulmonary disease.

## 2019-12-02 IMAGING — DX DG CHEST 2V
2 series · 2 of 2 positions shown · non-contrast
Comparison: 03/16/2018

CLINICAL DATA: Defibrillator replacement, lead placement

EXAM:
CHEST - 2 VIEW

[dg chest 2 view (1 of 2)]
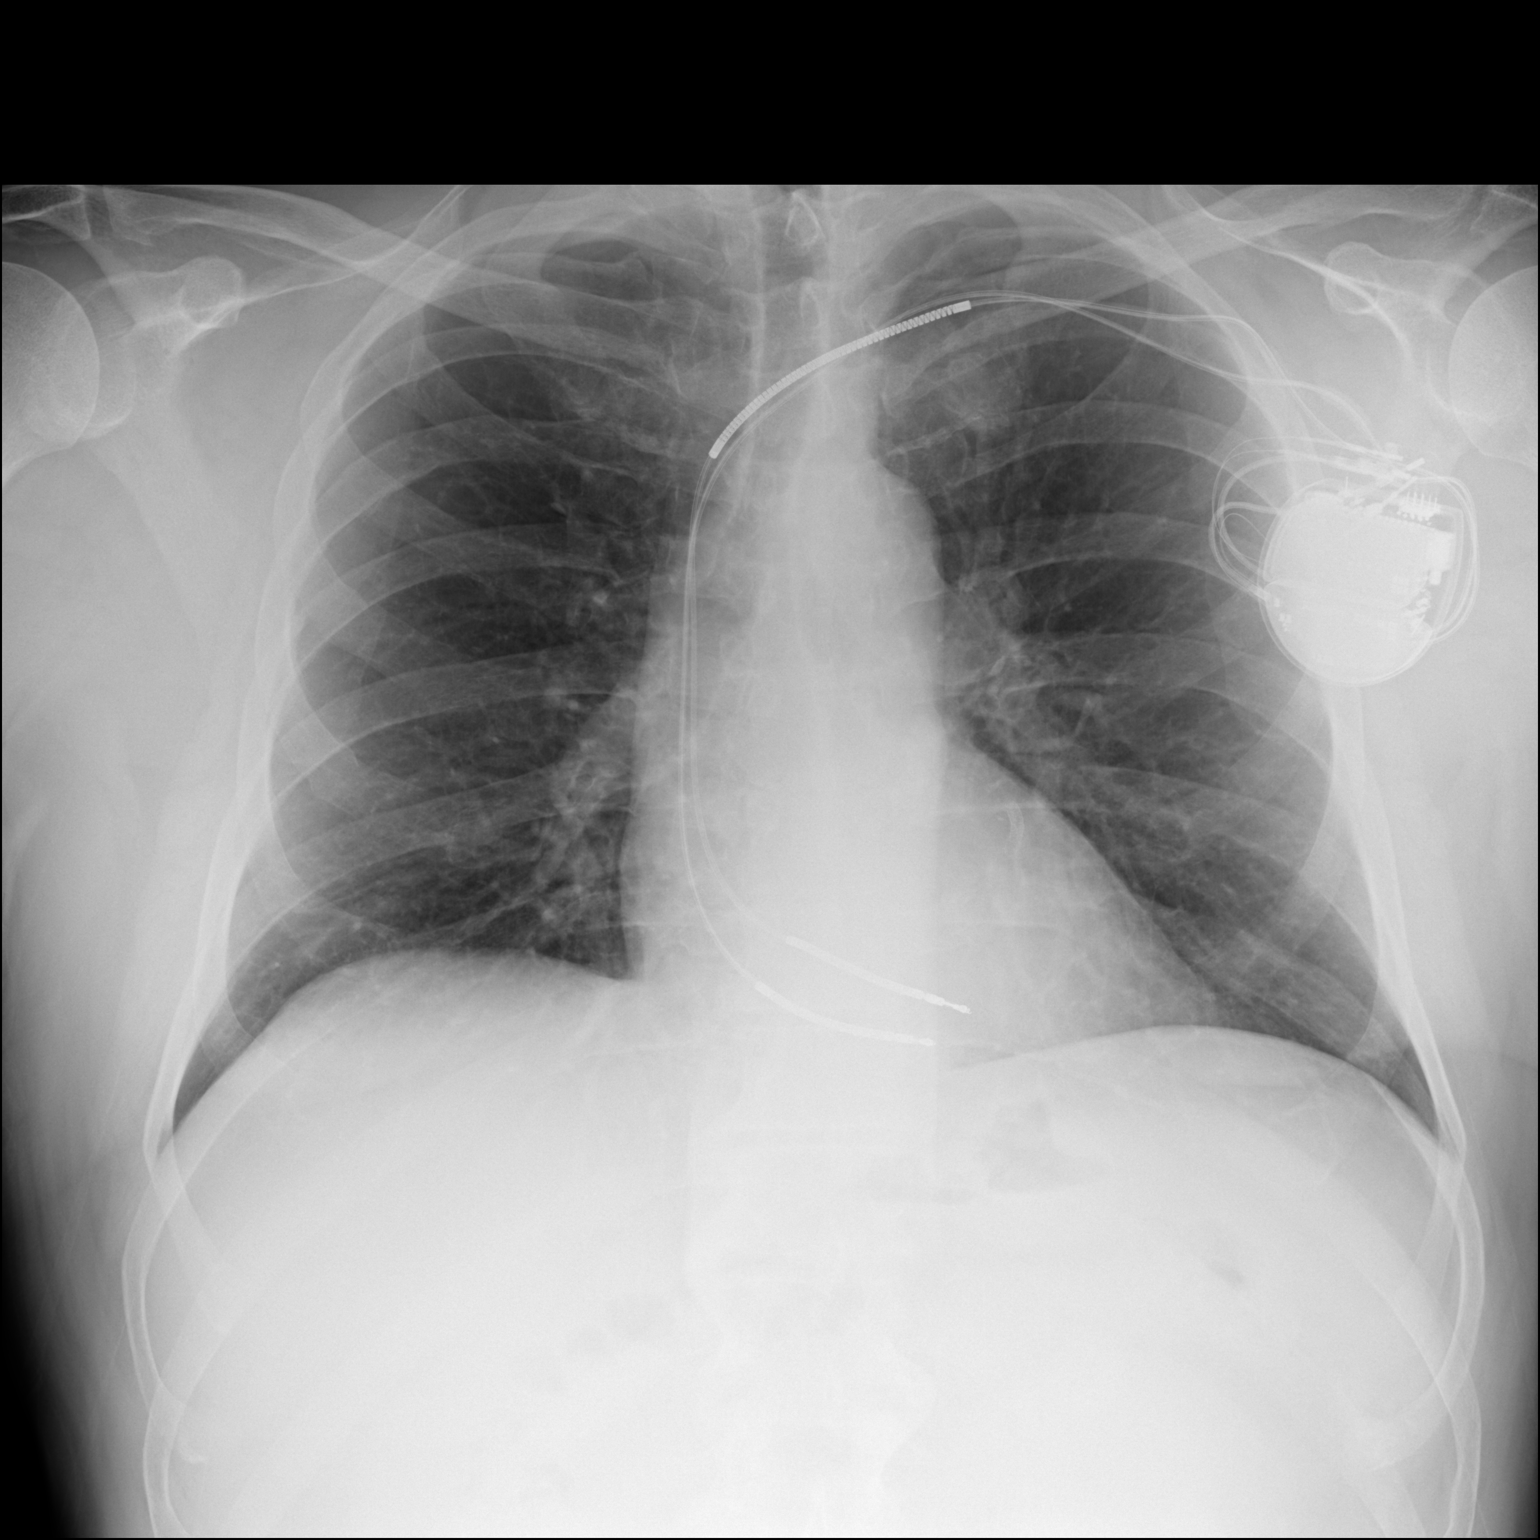

[dg chest 2 view (2 of 2)]
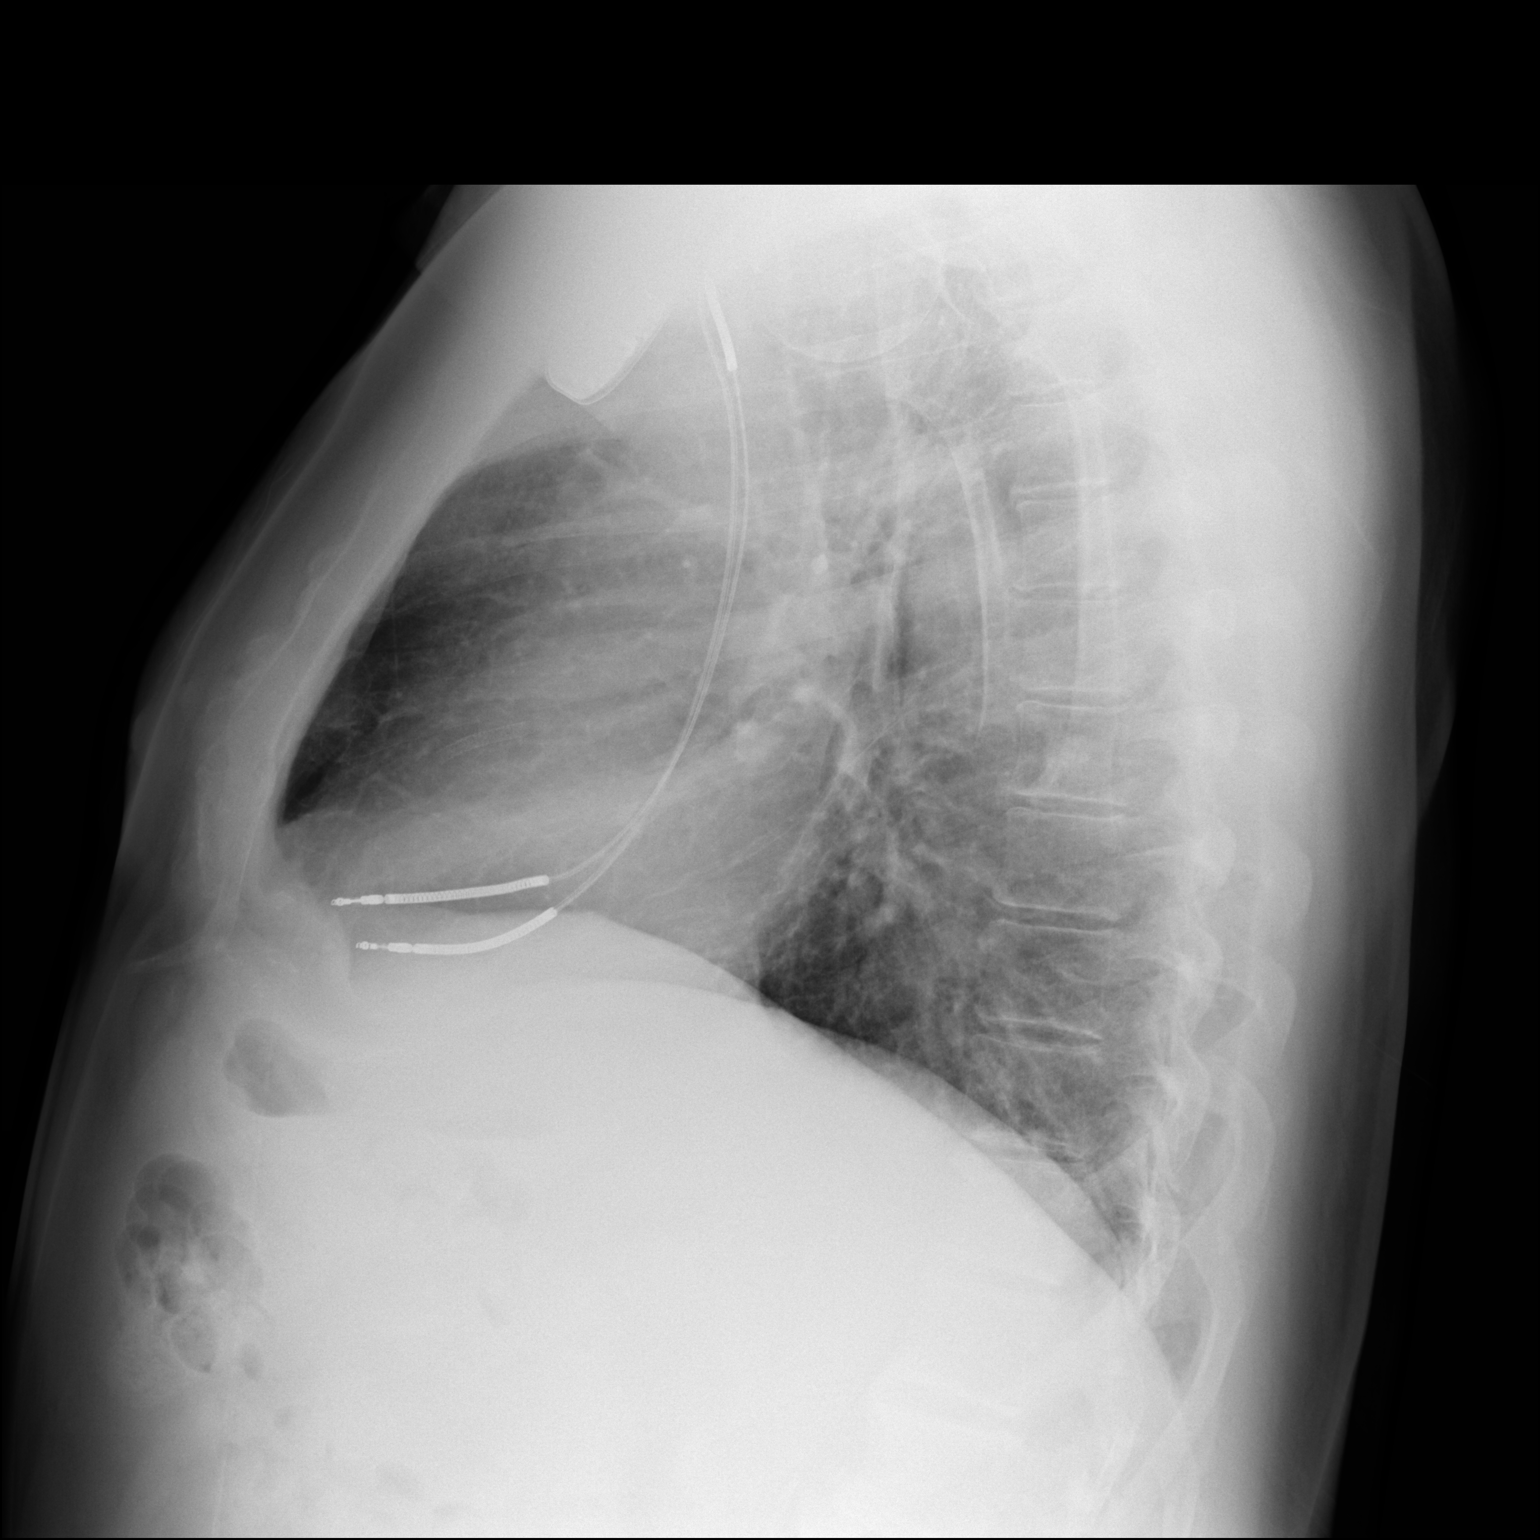

[2 of 2 positions shown; findings below may reference images not displayed]

FINDINGS: LEFT subclavian AICD with two leads projecting over RIGHT ventricle.

Coronary arterial stent noted.

Normal heart size, mediastinal contours, and pulmonary vascularity.

Lungs clear.

No pleural effusion or pneumothorax.

Bones unremarkable.
IMPRESSION: No acute abnormalities.

## 2020-01-06 ENCOUNTER — Ambulatory Visit (INDEPENDENT_AMBULATORY_CARE_PROVIDER_SITE_OTHER): Payer: PPO

## 2020-01-06 DIAGNOSIS — I255 Ischemic cardiomyopathy: Secondary | ICD-10-CM

## 2020-01-08 LAB — CUP PACEART REMOTE DEVICE CHECK
Battery Remaining Longevity: 79 mo
Battery Remaining Percentage: 78 %
Battery Voltage: 2.99 V
Brady Statistic RV Percent Paced: 1 %
Date Time Interrogation Session: 20211203212859
HighPow Impedance: 53 Ohm
HighPow Impedance: 53 Ohm
Implantable Lead Implant Date: 20200207
Implantable Lead Location: 753860
Implantable Lead Model: 7121
Implantable Pulse Generator Implant Date: 20200207
Lead Channel Impedance Value: 440 Ohm
Lead Channel Pacing Threshold Amplitude: 0.75 V
Lead Channel Pacing Threshold Pulse Width: 0.5 ms
Lead Channel Sensing Intrinsic Amplitude: 7 mV
Lead Channel Setting Pacing Amplitude: 2.5 V
Lead Channel Setting Pacing Pulse Width: 0.5 ms
Lead Channel Setting Sensing Sensitivity: 0.5 mV
Pulse Gen Serial Number: 9793547

## 2020-01-18 NOTE — Progress Notes (Signed)
Remote ICD transmission.   

## 2020-02-06 ENCOUNTER — Other Ambulatory Visit: Payer: Self-pay | Admitting: Cardiovascular Disease

## 2020-02-21 ENCOUNTER — Other Ambulatory Visit: Payer: Self-pay | Admitting: Cardiovascular Disease

## 2020-03-01 DIAGNOSIS — E782 Mixed hyperlipidemia: Secondary | ICD-10-CM | POA: Diagnosis not present

## 2020-03-01 DIAGNOSIS — Z0000 Encounter for general adult medical examination without abnormal findings: Secondary | ICD-10-CM | POA: Diagnosis not present

## 2020-03-01 DIAGNOSIS — Z125 Encounter for screening for malignant neoplasm of prostate: Secondary | ICD-10-CM | POA: Diagnosis not present

## 2020-03-01 DIAGNOSIS — R7303 Prediabetes: Secondary | ICD-10-CM | POA: Diagnosis not present

## 2020-03-01 DIAGNOSIS — I10 Essential (primary) hypertension: Secondary | ICD-10-CM | POA: Diagnosis not present

## 2020-03-01 DIAGNOSIS — N400 Enlarged prostate without lower urinary tract symptoms: Secondary | ICD-10-CM | POA: Diagnosis not present

## 2020-03-01 DIAGNOSIS — I4821 Permanent atrial fibrillation: Secondary | ICD-10-CM | POA: Diagnosis not present

## 2020-03-01 DIAGNOSIS — Z23 Encounter for immunization: Secondary | ICD-10-CM | POA: Diagnosis not present

## 2020-03-01 DIAGNOSIS — K219 Gastro-esophageal reflux disease without esophagitis: Secondary | ICD-10-CM | POA: Diagnosis not present

## 2020-03-01 DIAGNOSIS — K7581 Nonalcoholic steatohepatitis (NASH): Secondary | ICD-10-CM | POA: Diagnosis not present

## 2020-04-02 NOTE — Progress Notes (Signed)
Cardiology Office Note Date:  04/02/2020  Patient ID:  Jeremy Boyle, Jeremy Boyle 03-18-1955, MRN [id number] PCP:  Gordan Payment., MD  Cardiologist:  Dr. Clifton James Electrophysiologist: Dr. Ladona Ridgel     Chief Complaint: annual visit  History of Present Illness: [name] is a 65 y.o. male with history of CAD (PCI LAD 2005, and 2010), ICM (remote hx of LV mural thrombus, echo 2019 with sluggish flow in apex), chronic CHF (systolic), ICD, AFib, HTN, HLD.  He saw Dr. Clifton James last Jan 2021, was doing OK, no changes were made, planned for annual visit  He comes in today to be seen for Dr. Ladona Ridgel, last seen by him April 2021.  At that time doing OK,  class II symptoms, no changes were made.   TODAY He feels quite well, just saw Dr. Clifton James, no changes were made, his s/l NTG fille. He denies any device concerns, occasionally aware f the device, but no pain, and not nearly as noticeable as the prior device was. He just had labs done with his PMD and gave dr. Clifton James a copy, reportedly all looked good.  He denies any bleeding or signs of bleeding.   Device information SJM single chamber ICD implanted 09/28/2009 >> gen change, new RV lead 03/15/2018 "new [facility] defibrillator, serial #[id number], was connected to the old SVC shocking coil along with a new Saint Jude ICD lead" THIS WOULD BE CONSIDERED AN ABANDONED LEAD in d/w industry   Past Medical History:  Diagnosis Date  . Acute myocardial infarction of other anterior wall, episode of care unspecified   . Atrial fibrillation (HCC)   . Automatic implantable cardioverter-defibrillator in situ 01/01/2010   Qualifier: Diagnosis of  By: Ladona Ridgel, MD, Jerrell Mylar   . CARDIOMYOPATHY, ISCHEMIC 02/06/2009   Qualifier: Diagnosis of  By: Madelin Rear    . Cardiomyopathy, ischemic 07/27/2018  . Chronic systolic heart failure (HCC) 03/15/2018  . Coronary atherosclerosis of native coronary artery    diagnosed 12/10   . Failure of  implantable cardioverter-defibrillator (ICD) lead 03/15/2018  . HTN (hypertension) 01/06/2011  . HYPERLIPIDEMIA 02/06/2009   Qualifier: Diagnosis of  By: Madelin Rear    . ICD (implantable cardiac defibrillator) in place   . Implantable cardioverter-defibrillator (ICD) generator end of life 03/15/2018  . Ischemic cardiomyopathy   . 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 MURAL THROMBUS, APEX OF HEART 02/07/2009   Qualifier: Diagnosis of  By: Clifton James MD, Cristal Deer      Past Surgical History:  Procedure Laterality Date  . BACK SURGERY    . CARDIAC DEFIBRILLATOR PLACEMENT     St Jude  . ICD GENERATOR CHANGEOUT N/A 03/15/2018   Procedure: ICD GENERATOR CHANGEOUT;  Surgeon: Marinus Maw, MD;  Location: [facility]VAS[facility] CV LAB;  Service: Cardiovascular;  Laterality: N/A;  . KNEE SURGERY    . LEAD INSERTION N/A 03/15/2018   Procedure: LEAD INSERTION;  Surgeon: Marinus Maw, MD;  Location: [facility] INVASIVE CV LAB;  Service: Cardiovascular;  Laterality: N/A;  . SHOULDER SURGERY    . VASECTOMY      Current Outpatient Medications  Medication Sig Dispense Refill  . acetaminophen (TYLENOL) 500 MG tablet Take 500 mg by mouth every 6 (six) hours as needed (pain.).     Marland Kitchen aspirin EC 81 MG tablet Take 1 tablet (81 mg total) by mouth daily. 90 tablet 3  . atorvastatin (LIPITOR) 80 MG tablet TAKE 1 TABLET ONCE DAILY  30 tablet 11  . lisinopril (ZESTRIL) 10 MG tablet TAKE 1 TABLET BY MOUTH ONCE DAILY 90 tablet 0  . metoprolol tartrate (LOPRESSOR) 50 MG tablet TAKE ONE TABLET BY MOUTH 2 TIMES A DAY 180 tablet 0  . nitroGLYCERIN (NITROSTAT) 0.4 MG SL tablet Place 1 tablet (0.4 mg total) under the tongue every 5 (five) minutes as needed for chest pain (MAX 3 TABLETS). 25 tablet 4  . omeprazole (PRILOSEC) 40 MG capsule Take 40 mg by mouth as needed.    Bertram Gala Glycol-Propyl Glycol (LUBRICANT EYE DROPS) 0.4-0.3 % SOLN Place 1 drop into both eyes 3 (three) times daily as needed  (dry/irritated eyes.).    Marland Kitchen XARELTO 20 MG TABS tablet TAKE ONE TABLET BY MOUTH EVERY DAY WITH SUPPER 90 tablet 3   No current facility-administered medications for this visit.    Allergies:   Patient has no known allergies.   Social History:  The patient  reports that he quit smoking about 11 years ago. He has never used smokeless tobacco. He reports that he does not drink alcohol and does not use drugs.   Family History:  The patient's family history includes Coronary artery disease in his mother; Diabetes in his mother; Heart attack in his father.  ROS:  Please see the history of present illness.    All other systems are reviewed and otherwise negative.   PHYSICAL EXAM:  VS:  There were no vitals taken for this visit. BMI: There is no height or weight on file to calculate BMI. Well nourished, well developed, in no acute distress HEENT: normocephalic, atraumatic Neck: no JVD, carotid bruits or masses Cardiac:  RRR; no significant murmurs, no rubs, or gallops Lungs:  CTA b/l, no wheezing, rhonchi or rales Abd: soft, nontender MS: no deformity or atrophy Ext: no edema Skin: warm and dry, no rash Neuro:  No gross deficits appreciated Psych: euthymic mood, full affect  ICD site is stable, no tethering or discomfort   EKG:  Done today and reviewed by myself shows  SR, no changes  Device interrogation done today and reviewed by myself:  Battery and lead measurements are ggood No arrhythmias or therapies   02/10/2017; TTE Study Conclusions  - Left ventricle: The inferoapical region of the LV is aneursymal  with significant swirling of definity contrast in the apex which  is consistent with sluggish blood flow and cannot rule out early  forming thrombus. The cavity size was normal. Systolic function  was mildly reduced. The estimated ejection fraction was in the  range of 45% to 50%. There is akinesis of the apical myocardium.  There is akinesis of the apical  lateral, septal, inferolateral,  and inferior myocardium. There is severe hypokinesis of the  apicalanterior myocardium. There was an increased relative  contribution of atrial contraction to ventricular filling.  Doppler parameters are consistent with abnormal left ventricular  relaxation (grade 1 diastolic dysfunction).  - Left atrium: The atrium was mildly dilated.   Recent Labs: No results found for requested labs within last 8760 hours.  No results found for requested labs within last 8760 hours.   CrCl cannot be calculated (Patient's most recent lab result is older than the maximum 21 days allowed.).   Wt Readings from Last 3 Encounters:  05/06/19 223 lb (101.2 kg)  02/16/19 226 lb (102.5 kg)  07/27/18 218 lb (98.9 kg)     Other studies reviewed: Additional studies/records reviewed today include: summarized above  ASSESSMENT AND PLAN:  1. ICD  Intact function  2. ICM 3. Chronic CHF (systolic)     No symptoms or exam findings to suggest volume OL     corvue looks good  4. Paroxysmal AFib     CHA2DS2Vasc is 4      No symptoms to suggest any significant burden 5. LV mural thrombus     On xarelto      Labs done with his PMD    6. CAD     C/w Dr. Clifton James  7. HTN     Looks ok  8. HLD     Not addressed today    Disposition: F/u with remotes as usual and in cl inici in 1 year, sooner if needed.    Current medicines are reviewed at length with the patient today.  The patient did not have any concerns regarding medicines.  Norma Fredrickson, PA-C 04/02/2020 12:19 PM     [facility] [address] [place] [address] [phone] (office)  [phone] (fax)

## 2020-04-06 ENCOUNTER — Ambulatory Visit (INDEPENDENT_AMBULATORY_CARE_PROVIDER_SITE_OTHER): Payer: PPO

## 2020-04-06 DIAGNOSIS — I255 Ischemic cardiomyopathy: Secondary | ICD-10-CM

## 2020-04-08 NOTE — Progress Notes (Signed)
Chief Complaint  Patient presents with   Follow-up    CAD    History of Present Illness: 65 yo male with history of former tobacco abuse, CAD, ischemic cardiomyopathy s/p ICD placement and paroxysmal atrial fibrillation who is here today for cardiac follow up. He was admitted to [facility] in December 2010 with an anterior ST elevation MI with thrombosis of the mid LAD stent placed in 2005. I placed a drug eluting stent in the mid LAD. After his MI, his hospitalization was complicated by atrial fibrillation. He was also found to have severe hypokinesis of the anterior wall on echo and a possible LV thrombus. He has been anti-coagulated to limit the risk of LV thrombus given the fact that his anterior wall is akinetic. Cardiac MRI in the spring of 2011 with EF of 35% and full thickness scar in the apex, distal septum and inferoapex.He has been seen by Dr. Ladona Ridgel and an ICD was placed in August 2011 given his risk profile for sudden cardiac death. He has been followed in our device clinic by Dr. Ladona Ridgel. ICD generator change February 2020. Echo January 2019 with LVEF=45-50%. Contrast in apex. Akinesis of the apical lateral, septal, inferolateral and inferior myocardium. Grade 1 diastolic dysfunction.   He is here today for follow up. The patient denies any chest pain, dyspnea, palpitations, lower extremity edema, orthopnea, PND, dizziness, near syncope or syncope.    Primary Care Physician: Gordan Payment., MD  Past Medical History:  Diagnosis Date   Acute myocardial infarction of other anterior wall, episode of care unspecified    Atrial fibrillation [facility])    Automatic implantable cardioverter-defibrillator in situ 01/01/2010   Qualifier: Diagnosis of  By: Ladona Ridgel, MD, Jerrell Mylar    CARDIOMYOPATHY, ISCHEMIC 02/06/2009   Qualifier: Diagnosis of  By: Valinda Party RN, Nancy     Cardiomyopathy, ischemic 07/27/2018   Chronic systolic heart failure (HCC) 03/15/2018   Coronary atherosclerosis  of native coronary artery    diagnosed 12/10    Failure of implantable cardioverter-defibrillator (ICD) lead 03/15/2018   HTN (hypertension) 01/06/2011   HYPERLIPIDEMIA 02/06/2009   Qualifier: Diagnosis of  By: Valinda Party RN, Harriett Sine     ICD (implantable cardiac defibrillator) in place    Implantable cardioverter-defibrillator (ICD) generator end of life 03/15/2018   Ischemic cardiomyopathy    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)    MURAL THROMBUS, APEX OF HEART 02/07/2009   Qualifier: Diagnosis of  By: Clifton James MD, Cristal Deer      Past Surgical History:  Procedure Laterality Date   BACK SURGERY     CARDIAC DEFIBRILLATOR PLACEMENT     St Jude   ICD [place] N/A 03/15/2018   Procedure: ICD GENERATOR CHANGEOUT;  Surgeon: Marinus Maw, MD;  Location: [facility] INVASIVE CV LAB;  Service: Cardiovascular;  Laterality: N/A;   KNEE SURGERY     LEAD INSERTION N/A 03/15/2018   Procedure: LEAD INSERTION;  Surgeon: Marinus Maw, MD;  Location: [facility] INVASIVE CV LAB;  Service: Cardiovascular;  Laterality: N/A;   SHOULDER SURGERY     VASECTOMY      Current Outpatient Medications  Medication Sig Dispense Refill   acetaminophen (TYLENOL) 500 MG tablet Take 500 mg by mouth every 6 (six) hours as needed (pain.).      aspirin EC 81 MG tablet Take 1 tablet (81 mg total) by mouth daily. 90 tablet 3   atorvastatin (LIPITOR) 80 MG tablet TAKE 1 TABLET ONCE DAILY 30 tablet  11   ibuprofen (ADVIL) 200 MG tablet Take 1 tablet by mouth as needed.     lisinopril (ZESTRIL) 10 MG tablet TAKE 1 TABLET BY MOUTH ONCE DAILY 90 tablet 0   metoprolol tartrate (LOPRESSOR) 50 MG tablet TAKE ONE TABLET BY MOUTH 2 TIMES A DAY 180 tablet 0   omeprazole (PRILOSEC) 40 MG capsule Take 40 mg by mouth as needed.     Polyethyl Glycol-Propyl Glycol 0.4-0.3 % SOLN Place 1 drop into both eyes 3 (three) times daily as needed (dry/irritated eyes.).     XARELTO 20 MG TABS tablet TAKE ONE  TABLET BY MOUTH EVERY DAY WITH SUPPER 90 tablet 3   nitroGLYCERIN (NITROSTAT) 0.4 MG SL tablet Place 1 tablet (0.4 mg total) under the tongue every 5 (five) minutes as needed for chest pain (MAX 3 TABLETS). 25 tablet 3   No current facility-administered medications for this visit.    No Known Allergies  Social History   Socioeconomic History   Marital status: Married    Spouse name: Not on file   Number of children: 2   Years of education: Not on file   Highest education level: Not on file  Occupational History   Not on file  Tobacco Use   Smoking status: Former Smoker    Quit date: 01/03/2009    Years since quitting: 11.2   Smokeless tobacco: Never Used  Vaping Use   Vaping Use: Never used  Substance and Sexual Activity   Alcohol use: No   Drug use: No   Sexual activity: Not on file  Other Topics Concern   Not on file  Social History Narrative   Not on file   Social Determinants of Health   Financial Resource Strain: Not on file  Food Insecurity: Not on file  Transportation Needs: Not on file  Physical Activity: Not on file  Stress: Not on file  Social Connections: Not on file  Intimate Partner Violence: Not on file    Family History  Problem Relation Age of Onset   Coronary artery disease Mother        Cabd   Diabetes Mother    Heart attack Father        died 37    Review of Systems:  As stated in the HPI and otherwise negative.   BP 124/80    Pulse 79    Ht 5\' 11"  (1.803 m)    Wt 223 lb 3.2 oz (101.2 kg)    SpO2 97%    BMI 31.13 kg/m   Physical Examination: General: Well developed, well nourished, NAD  HEENT: OP clear, mucus membranes moist  SKIN: warm, dry. No rashes. Neuro: No focal deficits  Musculoskeletal: Muscle strength 5/5 all ext  Psychiatric: Mood and affect normal  Neck: No JVD, no carotid bruits, no thyromegaly, no lymphadenopathy.  Lungs:Clear bilaterally, no wheezes, rhonci, crackles Cardiovascular: Regular rate  and rhythm. No murmurs, gallops or rubs. Abdomen:Soft. Bowel sounds present. Non-tender.  Extremities: No lower extremity edema. Pulses are 2 + in the bilateral DP/PT.  Echo January 2019: - Left ventricle: The inferoapical region of the LV is aneursymal   with significant swirling of definity contrast in the apex which   is consistent with sluggish blood flow and cannot rule out early   forming thrombus. The cavity size was normal. Systolic function   was mildly reduced. The estimated ejection fraction was in the   range of 45% to 50%. There is akinesis of the apical myocardium.  There is akinesis of the apical lateral, septal, inferolateral,   and inferior myocardium. There is severe hypokinesis of the   apicalanterior myocardium. There was an increased relative   contribution of atrial contraction to ventricular filling.   Doppler parameters are consistent with abnormal left ventricular   relaxation (grade 1 diastolic dysfunction). - Left atrium: The atrium was mildly dilated.   EKG:  EKG is not ordered today. The ekg ordered today demonstrates   Recent Labs: No results found for requested labs within last 8760 hours.   Lipid Panel Lipid Panel     Component Value Date/Time   CHOL 153 02/16/2019 0919   TRIG 183 (H) 02/16/2019 0919   HDL 35 (L) 02/16/2019 0919   CHOLHDL 4.4 02/16/2019 0919   CHOLHDL 4.5 10/19/2015 0941   VLDL 50 (H) 10/19/2015 0941   LDLCALC 87 02/16/2019 0919   LDLDIRECT 113.4 01/07/2011 1021     Wt Readings from Last 3 Encounters:  04/09/20 223 lb (101.2 kg)  04/09/20 223 lb 3.2 oz (101.2 kg)  05/06/19 223 lb (101.2 kg)    Assessment and Plan:   1. CAD without angina: No chest pain. Will continue ASA, beta blocker and statin.    2. Ischemic Cardiomyopathy: ICD in place and followed by Dr. Ladona Ridgel. LVEF=45-50% by echo January 2019 with apical akinesis.  Will continue beta blocker and Ace-inh  3. MURAL THROMBUS, APEX OF LV : Sluggish flow seen in  apex on echo January 2019. Will continue Xarelto for prevention of LV thrombus given apical akinesis.    4. HTN: BP is well controlled. No changes  5. HYPERLIPIDEMIA:  LDL near goal in January 2022 in primary care. Continue statin.   Current medicines are reviewed at length with the patient today.  The patient does not have concerns regarding medicines.  The following changes have been made:  no change  Labs/ tests ordered today include:   No orders of the defined types were placed in this encounter.   Disposition:   FU with me in 12  months  Signed, Verne Carrow, MD 04/09/2020 10:18 AM    [facility] [address] [place], [place], [place]  [address] Phone: [phone]; Fax: [phone]

## 2020-04-09 ENCOUNTER — Ambulatory Visit (INDEPENDENT_AMBULATORY_CARE_PROVIDER_SITE_OTHER): Payer: PPO | Admitting: Physician Assistant

## 2020-04-09 ENCOUNTER — Encounter (INDEPENDENT_AMBULATORY_CARE_PROVIDER_SITE_OTHER): Payer: Self-pay

## 2020-04-09 ENCOUNTER — Encounter: Payer: Self-pay | Admitting: Cardiovascular Disease

## 2020-04-09 ENCOUNTER — Other Ambulatory Visit: Payer: Self-pay

## 2020-04-09 ENCOUNTER — Ambulatory Visit: Payer: PPO | Admitting: Cardiovascular Disease

## 2020-04-09 ENCOUNTER — Encounter: Payer: Self-pay | Admitting: Physician Assistant

## 2020-04-09 VITALS — BP 124/80 | HR 79 | Ht 71.0 in | Wt 223.0 lb

## 2020-04-09 VITALS — BP 124/80 | HR 79 | Ht 71.0 in | Wt 223.2 lb

## 2020-04-09 DIAGNOSIS — I2129 ST elevation (STEMI) myocardial infarction involving other sites: Secondary | ICD-10-CM | POA: Diagnosis not present

## 2020-04-09 DIAGNOSIS — Z95810 Presence of automatic (implantable) cardiac defibrillator: Secondary | ICD-10-CM | POA: Diagnosis not present

## 2020-04-09 DIAGNOSIS — I255 Ischemic cardiomyopathy: Secondary | ICD-10-CM

## 2020-04-09 DIAGNOSIS — I480 Paroxysmal atrial fibrillation: Secondary | ICD-10-CM

## 2020-04-09 DIAGNOSIS — I5022 Chronic systolic (congestive) heart failure: Secondary | ICD-10-CM

## 2020-04-09 DIAGNOSIS — I2510 Atherosclerotic heart disease of native coronary artery without angina pectoris: Secondary | ICD-10-CM

## 2020-04-09 DIAGNOSIS — E7800 Pure hypercholesterolemia, unspecified: Secondary | ICD-10-CM

## 2020-04-09 DIAGNOSIS — I10 Essential (primary) hypertension: Secondary | ICD-10-CM

## 2020-04-09 LAB — CUP PACEART INCLINIC DEVICE CHECK
Battery Remaining Longevity: 79 mo
Brady Statistic RV Percent Paced: 0.01 %
Date Time Interrogation Session: 20220307104116
HighPow Impedance: 58.3668
Implantable Lead Implant Date: 20200207
Implantable Lead Location: 753860
Implantable Lead Model: 7121
Implantable Pulse Generator Implant Date: 20200207
Lead Channel Impedance Value: 487.5 Ohm
Lead Channel Pacing Threshold Amplitude: 0.75 V
Lead Channel Pacing Threshold Amplitude: 0.75 V
Lead Channel Pacing Threshold Pulse Width: 0.5 ms
Lead Channel Pacing Threshold Pulse Width: 0.5 ms
Lead Channel Sensing Intrinsic Amplitude: 7.5 mV
Lead Channel Setting Pacing Amplitude: 2.5 V
Lead Channel Setting Pacing Pulse Width: 0.5 ms
Lead Channel Setting Sensing Sensitivity: 0.5 mV
Pulse Gen Serial Number: 9793547

## 2020-04-09 LAB — CUP PACEART REMOTE DEVICE CHECK
Battery Remaining Longevity: 78 mo
Battery Remaining Percentage: 75 %
Battery Voltage: 2.99 V
Brady Statistic RV Percent Paced: 1 %
Date Time Interrogation Session: 20220304020639
HighPow Impedance: 58 Ohm
HighPow Impedance: 58 Ohm
Implantable Lead Implant Date: 20200207
Implantable Lead Location: 753860
Implantable Lead Model: 7121
Implantable Pulse Generator Implant Date: 20200207
Lead Channel Impedance Value: 460 Ohm
Lead Channel Pacing Threshold Amplitude: 0.75 V
Lead Channel Pacing Threshold Pulse Width: 0.5 ms
Lead Channel Sensing Intrinsic Amplitude: 7.5 mV
Lead Channel Setting Pacing Amplitude: 2.5 V
Lead Channel Setting Pacing Pulse Width: 0.5 ms
Lead Channel Setting Sensing Sensitivity: 0.5 mV
Pulse Gen Serial Number: 9793547

## 2020-04-09 MED ORDER — NITROGLYCERIN 0.4 MG SL SUBL: 0.4000 mg | SUBLINGUAL_TABLET | SUBLINGUAL | 3 refills | Status: AC | PRN

## 2020-04-09 NOTE — Patient Instructions (Signed)

## 2020-04-09 NOTE — Patient Instructions (Addendum)
Medication Instructions:   Your physician recommends that you continue on your current medications as directed. Please refer to the Current Medication list given to you today.   *If you need a refill on your cardiac medications before your next appointment, please call your pharmacy*   Lab Work: NONE ORDERED  TODAY   If you have labs (blood work) drawn today and your tests are completely normal, you will receive your results only by: . MyChart Message (if you have MyChart) OR . A paper copy in the mail If you have any lab test that is abnormal or we need to change your treatment, we will call you to review the results.   Testing/Procedures: NONE ORDERED  TODAY   Follow-Up: At [facility], you and your health needs are our priority.  As part of our continuing mission to provide you with exceptional heart care, we have created designated Provider Care Teams.  These Care Teams include your primary Cardiologist (physician) and Advanced Practice Providers (APPs -  Physician Assistants and Nurse Practitioners) who all work together to provide you with the care you need, when you need it.  We recommend signing up for the patient portal called "MyChart".  Sign up information is provided on this After Visit Summary.  MyChart is used to connect with patients for Virtual Visits (Telemedicine).  Patients are able to view lab/test results, encounter notes, upcoming appointments, etc.  Non-urgent messages can be sent to your provider as well.   To learn more about what you can do with MyChart, go to [email or web address].    Your next appointment:   1 year   The format for your next appointment:   In Person  Provider:   You may see Dr. Taylor   or one of the following Advanced Practice Providers on your designated Care Team:    Amber Seiler, NP  Renee Ursuy, PA-C  Michael "Andy" Tillery, PA-C    Other Instructions   

## 2020-04-17 NOTE — Progress Notes (Signed)
Remote ICD transmission.   

## 2020-05-08 ENCOUNTER — Other Ambulatory Visit: Payer: Self-pay | Admitting: Cardiovascular Disease

## 2020-05-17 ENCOUNTER — Other Ambulatory Visit: Payer: Self-pay | Admitting: Cardiovascular Disease

## 2020-05-17 NOTE — Telephone Encounter (Signed)
Prescription refill request for Xarelto received.  Indication: Atrial Fib Last office visit: 04/09/20 Weight: 101.2kg Age: 65 Scr: 0.89 on 03/01/20 CrCl: 118.45  Based on above findings Xarelto 20mg  daily is the appropriate dose.  Refill approved.

## 2020-07-06 ENCOUNTER — Ambulatory Visit (INDEPENDENT_AMBULATORY_CARE_PROVIDER_SITE_OTHER): Payer: PPO

## 2020-07-06 DIAGNOSIS — I5022 Chronic systolic (congestive) heart failure: Secondary | ICD-10-CM | POA: Diagnosis not present

## 2020-07-07 LAB — CUP PACEART REMOTE DEVICE CHECK
Battery Remaining Longevity: 76 mo
Battery Remaining Percentage: 74 %
Battery Voltage: 2.99 V
Brady Statistic RV Percent Paced: 1 %
Date Time Interrogation Session: 20220603020021
HighPow Impedance: 56 Ohm
HighPow Impedance: 56 Ohm
Implantable Lead Implant Date: 20200207
Implantable Lead Location: 753860
Implantable Lead Model: 7121
Implantable Pulse Generator Implant Date: 20200207
Lead Channel Impedance Value: 430 Ohm
Lead Channel Pacing Threshold Amplitude: 0.75 V
Lead Channel Pacing Threshold Pulse Width: 0.5 ms
Lead Channel Sensing Intrinsic Amplitude: 8.2 mV
Lead Channel Setting Pacing Amplitude: 2.5 V
Lead Channel Setting Pacing Pulse Width: 0.5 ms
Lead Channel Setting Sensing Sensitivity: 0.5 mV
Pulse Gen Serial Number: 9793547

## 2020-07-24 NOTE — Progress Notes (Signed)
Remote ICD transmission.   

## 2020-08-07 ENCOUNTER — Other Ambulatory Visit: Payer: Self-pay | Admitting: Cardiovascular Disease

## 2020-08-09 ENCOUNTER — Other Ambulatory Visit: Payer: Self-pay

## 2020-08-09 MED ORDER — LISINOPRIL 10 MG PO TABS: 10.0000 mg | ORAL_TABLET | Freq: Every day | ORAL | 2 refills | Status: DC

## 2020-08-09 NOTE — Telephone Encounter (Signed)
Pt's medication was sent to pt's pharmacy as requested. Confirmation received.  °

## 2020-09-05 DIAGNOSIS — K7581 Nonalcoholic steatohepatitis (NASH): Secondary | ICD-10-CM | POA: Diagnosis not present

## 2020-09-05 DIAGNOSIS — M159 Polyosteoarthritis, unspecified: Secondary | ICD-10-CM | POA: Diagnosis not present

## 2020-09-05 DIAGNOSIS — K219 Gastro-esophageal reflux disease without esophagitis: Secondary | ICD-10-CM | POA: Diagnosis not present

## 2020-09-05 DIAGNOSIS — R7303 Prediabetes: Secondary | ICD-10-CM | POA: Diagnosis not present

## 2020-09-05 DIAGNOSIS — I2510 Atherosclerotic heart disease of native coronary artery without angina pectoris: Secondary | ICD-10-CM | POA: Diagnosis not present

## 2020-09-05 DIAGNOSIS — I10 Essential (primary) hypertension: Secondary | ICD-10-CM | POA: Diagnosis not present

## 2020-09-05 DIAGNOSIS — E782 Mixed hyperlipidemia: Secondary | ICD-10-CM | POA: Diagnosis not present

## 2020-09-05 DIAGNOSIS — I4821 Permanent atrial fibrillation: Secondary | ICD-10-CM | POA: Diagnosis not present

## 2020-10-05 ENCOUNTER — Ambulatory Visit (INDEPENDENT_AMBULATORY_CARE_PROVIDER_SITE_OTHER): Payer: PPO

## 2020-10-05 DIAGNOSIS — I255 Ischemic cardiomyopathy: Secondary | ICD-10-CM

## 2020-10-09 LAB — CUP PACEART REMOTE DEVICE CHECK
Battery Remaining Longevity: 73 mo
Battery Remaining Percentage: 72 %
Battery Voltage: 2.99 V
Brady Statistic RV Percent Paced: 1 %
Date Time Interrogation Session: 20220902020900
HighPow Impedance: 55 Ohm
HighPow Impedance: 55 Ohm
Implantable Lead Implant Date: 20200207
Implantable Lead Location: 753860
Implantable Lead Model: 7121
Implantable Pulse Generator Implant Date: 20200207
Lead Channel Impedance Value: 430 Ohm
Lead Channel Pacing Threshold Amplitude: 0.75 V
Lead Channel Pacing Threshold Pulse Width: 0.5 ms
Lead Channel Sensing Intrinsic Amplitude: 8 mV
Lead Channel Setting Pacing Amplitude: 2.5 V
Lead Channel Setting Pacing Pulse Width: 0.5 ms
Lead Channel Setting Sensing Sensitivity: 0.5 mV
Pulse Gen Serial Number: 9793547

## 2020-10-16 NOTE — Progress Notes (Signed)
Remote ICD transmission.   

## 2020-11-08 DIAGNOSIS — Z23 Encounter for immunization: Secondary | ICD-10-CM | POA: Diagnosis not present

## 2020-11-19 ENCOUNTER — Other Ambulatory Visit: Payer: Self-pay | Admitting: Cardiovascular Disease

## 2020-11-19 NOTE — Telephone Encounter (Signed)
Xarelto 20 mg refill request received. Pt is 65 years old, weight- 101.2 kg, Crea- 0.86 on 03/30/20, last seen by Elwyn Lade on 04/09/20, Diagnosis-PAF, CrCl- 122.58; Dose is appropriate based on dosing criteria. Will send in refill to requested pharmacy.

## 2021-01-04 ENCOUNTER — Ambulatory Visit (INDEPENDENT_AMBULATORY_CARE_PROVIDER_SITE_OTHER): Payer: PPO

## 2021-01-04 DIAGNOSIS — I255 Ischemic cardiomyopathy: Secondary | ICD-10-CM

## 2021-01-07 LAB — CUP PACEART REMOTE DEVICE CHECK
Battery Remaining Longevity: 72 mo
Battery Remaining Percentage: 70 %
Battery Voltage: 2.98 V
Brady Statistic RV Percent Paced: 1 %
Date Time Interrogation Session: 20221202232144
HighPow Impedance: 55 Ohm
HighPow Impedance: 55 Ohm
Implantable Lead Implant Date: 20200207
Implantable Lead Location: 753860
Implantable Lead Model: 7121
Implantable Pulse Generator Implant Date: 20200207
Lead Channel Impedance Value: 440 Ohm
Lead Channel Pacing Threshold Amplitude: 0.75 V
Lead Channel Pacing Threshold Pulse Width: 0.5 ms
Lead Channel Sensing Intrinsic Amplitude: 8.2 mV
Lead Channel Setting Pacing Amplitude: 2.5 V
Lead Channel Setting Pacing Pulse Width: 0.5 ms
Lead Channel Setting Sensing Sensitivity: 0.5 mV
Pulse Gen Serial Number: 9793547

## 2021-01-15 NOTE — Progress Notes (Signed)
Remote ICD transmission.   

## 2021-02-14 ENCOUNTER — Other Ambulatory Visit: Payer: Self-pay | Admitting: *Deleted

## 2021-02-14 MED ORDER — ATORVASTATIN CALCIUM 80 MG PO TABS: 80.0000 mg | ORAL_TABLET | Freq: Every day | ORAL | 0 refills | Status: DC

## 2021-04-05 ENCOUNTER — Ambulatory Visit (INDEPENDENT_AMBULATORY_CARE_PROVIDER_SITE_OTHER): Payer: Medicare HMO

## 2021-04-05 DIAGNOSIS — I5022 Chronic systolic (congestive) heart failure: Secondary | ICD-10-CM

## 2021-04-05 LAB — CUP PACEART REMOTE DEVICE CHECK
Battery Remaining Longevity: 71 mo
Battery Remaining Percentage: 68 %
Battery Voltage: 2.98 V
Brady Statistic RV Percent Paced: 1 %
Date Time Interrogation Session: 20230303101638
HighPow Impedance: 54 Ohm
HighPow Impedance: 54 Ohm
Implantable Lead Implant Date: 20200207
Implantable Lead Location: 753860
Implantable Lead Model: 7121
Implantable Pulse Generator Implant Date: 20200207
Lead Channel Impedance Value: 490 Ohm
Lead Channel Pacing Threshold Amplitude: 0.75 V
Lead Channel Pacing Threshold Pulse Width: 0.5 ms
Lead Channel Sensing Intrinsic Amplitude: 8.7 mV
Lead Channel Setting Pacing Amplitude: 2.5 V
Lead Channel Setting Pacing Pulse Width: 0.5 ms
Lead Channel Setting Sensing Sensitivity: 0.5 mV
Pulse Gen Serial Number: 9793547

## 2021-04-16 NOTE — Progress Notes (Signed)
Remote ICD transmission.   

## 2021-04-18 ENCOUNTER — Other Ambulatory Visit: Payer: Self-pay

## 2021-04-18 MED ORDER — LISINOPRIL 10 MG PO TABS: 10.0000 mg | ORAL_TABLET | Freq: Every day | ORAL | 0 refills | Status: DC

## 2021-04-18 NOTE — Telephone Encounter (Signed)
Pt's medication was sent to pt's pharmacy as requested. Confirmation received.  °

## 2021-05-09 ENCOUNTER — Other Ambulatory Visit: Payer: Self-pay

## 2021-05-09 DIAGNOSIS — I480 Paroxysmal atrial fibrillation: Secondary | ICD-10-CM

## 2021-05-09 MED ORDER — RIVAROXABAN 20 MG PO TABS: ORAL_TABLET | ORAL | 1 refills | Status: DC

## 2021-05-09 NOTE — Telephone Encounter (Signed)
Prescription refill request for Xarelto received.  ?Indication: Afib  ?Last office visit: 04/09/20 Jeremy Boyle) - Pt has scheduled appt with Dr Angelena Form on 06/24/21.  ?Weight: 101.2kg ?Age: 66 ?Scr: 0.88 (03/13/21) ?CrCl: 118.38ml/min ? ?Appropriate dose and refill sent to requested pharmacy.  ?

## 2021-05-13 ENCOUNTER — Other Ambulatory Visit: Payer: Self-pay | Admitting: Cardiovascular Disease

## 2021-05-13 MED ORDER — METOPROLOL TARTRATE 50 MG PO TABS: 50.0000 mg | ORAL_TABLET | Freq: Two times a day (BID) | ORAL | 0 refills | Status: DC

## 2021-06-24 ENCOUNTER — Ambulatory Visit: Payer: PPO | Admitting: Cardiovascular Disease

## 2021-06-24 ENCOUNTER — Encounter: Payer: Self-pay | Admitting: Cardiovascular Disease

## 2021-06-24 ENCOUNTER — Ambulatory Visit (INDEPENDENT_AMBULATORY_CARE_PROVIDER_SITE_OTHER): Payer: Medicare HMO | Admitting: Internal Medicine

## 2021-06-24 VITALS — BP 112/70 | HR 54 | Ht 71.0 in | Wt 208.4 lb

## 2021-06-24 VITALS — BP 112/70 | HR 54 | Ht 71.0 in | Wt 208.0 lb

## 2021-06-24 DIAGNOSIS — I2510 Atherosclerotic heart disease of native coronary artery without angina pectoris: Secondary | ICD-10-CM

## 2021-06-24 DIAGNOSIS — I5022 Chronic systolic (congestive) heart failure: Secondary | ICD-10-CM | POA: Diagnosis not present

## 2021-06-24 DIAGNOSIS — Z95810 Presence of automatic (implantable) cardiac defibrillator: Secondary | ICD-10-CM | POA: Diagnosis not present

## 2021-06-24 DIAGNOSIS — I255 Ischemic cardiomyopathy: Secondary | ICD-10-CM

## 2021-06-24 DIAGNOSIS — E7800 Pure hypercholesterolemia, unspecified: Secondary | ICD-10-CM

## 2021-06-24 DIAGNOSIS — I10 Essential (primary) hypertension: Secondary | ICD-10-CM

## 2021-06-24 DIAGNOSIS — I2129 ST elevation (STEMI) myocardial infarction involving other sites: Secondary | ICD-10-CM

## 2021-06-24 MED ORDER — LISINOPRIL 10 MG PO TABS: 10.0000 mg | ORAL_TABLET | Freq: Every day | ORAL | 3 refills | Status: AC

## 2021-06-24 MED ORDER — ROSUVASTATIN CALCIUM 40 MG PO TABS: 40.0000 mg | ORAL_TABLET | Freq: Every day | ORAL | 3 refills | Status: DC

## 2021-06-24 NOTE — Patient Instructions (Addendum)
Medication Instructions:  Your physician has recommended you make the following change in your medication:  1.) start rosuvastatin 40 mg daily 2.) stop atorvastatin   *If you need a refill on your cardiac medications before your next appointment, please call your pharmacy*   Lab Work: NONE If you have labs (blood work) drawn today and your tests are completely normal, you will receive your results only by: MyChart Message (if you have MyChart) OR A paper copy in the mail If you have any lab test that is abnormal or we need to change your treatment, we will call you to review the results.   Testing/Procedures: NONE   Follow-Up: At [facility], you and your health needs are our priority.  As part of our continuing mission to provide you with exceptional heart care, we have created designated Provider Care Teams.  These Care Teams include your primary Cardiologist (physician) and Advanced Practice Providers (APPs -  Physician Assistants and Nurse Practitioners) who all work together to provide you with the care you need, when you need it.  We recommend signing up for the patient portal called "MyChart".  Sign up information is provided on this After Visit Summary.  MyChart is used to connect with patients for Virtual Visits (Telemedicine).  Patients are able to view lab/test results, encounter notes, upcoming appointments, etc.  Non-urgent messages can be sent to your provider as well.   To learn more about what you can do with MyChart, go to [email or web address].    Your next appointment:   12 month(s)  The format for your next appointment:   In Person  Provider:   Verne Carrow, MD     Other Instructions   Important Information About Sugar

## 2021-06-24 NOTE — Progress Notes (Signed)
Chief Complaint  Patient presents with   Follow-up    CAD   History of Present Illness: 66 yo male with history of former tobacco abuse, CAD, ischemic cardiomyopathy s/p ICD placement and paroxysmal atrial fibrillation who is here today for cardiac follow up. He was admitted to [facility] in December 2010 with an anterior ST elevation MI with thrombosis of the mid LAD stent placed in 2005. I placed a drug eluting stent in the mid LAD. After his MI, his hospitalization was complicated by atrial fibrillation. He was also found to have severe hypokinesis of the anterior wall on echo and a possible LV thrombus. He has been anti-coagulated to limit the risk of LV thrombus given the fact that his anterior wall is akinetic. Cardiac MRI in the spring of 2011 with EF of 35% and full thickness scar in the apex, distal septum and inferoapex. He has been seen by Dr. Ladona Ridgel and an ICD was placed in August 2011 given his risk profile for sudden cardiac death. He has been followed in our device clinic by Dr. Ladona Ridgel. ICD generator change February 2020. Echo January 2019 with LVEF=45-50%. Contrast in apex. Akinesis of the apical lateral, septal, inferolateral and inferior myocardium. Grade 1 diastolic dysfunction.   He is here today for follow up. The patient denies any chest pain, dyspnea, palpitations, lower extremity edema, orthopnea, PND, dizziness, near syncope or syncope. He has moved to [place], [place].    Primary Care Physician: Marolyn Hammock  Past Medical History:  Diagnosis Date   Acute myocardial infarction of other anterior wall, episode of care unspecified    Atrial fibrillation [facility])    Automatic implantable cardioverter-defibrillator in situ 01/01/2010   Qualifier: Diagnosis of  By: Ladona Ridgel, MD, Jerrell Mylar    CARDIOMYOPATHY, ISCHEMIC 02/06/2009   Qualifier: Diagnosis of  By: Valinda Party RN, Nancy     Cardiomyopathy, ischemic 07/27/2018   Chronic systolic heart failure (HCC) 03/15/2018    Coronary atherosclerosis of native coronary artery    diagnosed 12/10    Failure of implantable cardioverter-defibrillator (ICD) lead 03/15/2018   HTN (hypertension) 01/06/2011   HYPERLIPIDEMIA 02/06/2009   Qualifier: Diagnosis of  By: Valinda Party RN, Harriett Sine     ICD (implantable cardiac defibrillator) in place    Implantable cardioverter-defibrillator (ICD) generator end of life 03/15/2018   Ischemic cardiomyopathy    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)    MURAL THROMBUS, APEX OF HEART 02/07/2009   Qualifier: Diagnosis of  By: Clifton James MD, Cristal Deer      Past Surgical History:  Procedure Laterality Date   BACK SURGERY     CARDIAC DEFIBRILLATOR PLACEMENT     St Jude   ICD [place] N/A 03/15/2018   Procedure: ICD GENERATOR CHANGEOUT;  Surgeon: Marinus Maw, MD;  Location: [facility] INVASIVE CV LAB;  Service: Cardiovascular;  Laterality: N/A;   KNEE SURGERY     LEAD INSERTION N/A 03/15/2018   Procedure: LEAD INSERTION;  Surgeon: Marinus Maw, MD;  Location: [facility] INVASIVE CV LAB;  Service: Cardiovascular;  Laterality: N/A;   SHOULDER SURGERY     VASECTOMY      Current Outpatient Medications  Medication Sig Dispense Refill   acetaminophen (TYLENOL) 500 MG tablet Take 500 mg by mouth every 6 (six) hours as needed (pain.).      aspirin EC 81 MG tablet Take 1 tablet (81 mg total) by mouth daily. 90 tablet 3   ibuprofen (ADVIL) 200 MG tablet Take  1 tablet by mouth as needed.     metoprolol tartrate (LOPRESSOR) 50 MG tablet Take 1 tablet (50 mg total) by mouth 2 (two) times daily. 180 tablet 0   nitroGLYCERIN (NITROSTAT) 0.4 MG SL tablet Place 1 tablet (0.4 mg total) under the tongue every 5 (five) minutes as needed for chest pain (MAX 3 TABLETS). 25 tablet 3   omeprazole (PRILOSEC) 40 MG capsule Take 40 mg by mouth as needed.     Polyethyl Glycol-Propyl Glycol 0.4-0.3 % SOLN Place 1 drop into both eyes 3 (three) times daily as needed (dry/irritated eyes.).      rivaroxaban (XARELTO) 20 MG TABS tablet TAKE ONE TABLET BY MOUTH EVERY DAY WITH SUPPER 90 tablet 1   rosuvastatin (CRESTOR) 40 MG tablet Take 1 tablet (40 mg total) by mouth daily. 90 tablet 3   lisinopril (ZESTRIL) 10 MG tablet Take 1 tablet (10 mg total) by mouth daily. 90 tablet 3   No current facility-administered medications for this visit.    No Known Allergies  Social History   Socioeconomic History   Marital status: Married    Spouse name: Not on file   Number of children: 2   Years of education: Not on file   Highest education level: Not on file  Occupational History   Not on file  Tobacco Use   Smoking status: Former    Types: Cigarettes    Quit date: 01/03/2009    Years since quitting: 12.4   Smokeless tobacco: Never  Vaping Use   Vaping Use: Never used  Substance and Sexual Activity   Alcohol use: No   Drug use: No   Sexual activity: Not on file  Other Topics Concern   Not on file  Social History Narrative   Not on file   Social Determinants of Health   Financial Resource Strain: Not on file  Food Insecurity: Not on file  Transportation Needs: Not on file  Physical Activity: Not on file  Stress: Not on file  Social Connections: Not on file  Intimate Partner Violence: Not on file    Family History  Problem Relation Age of Onset   Coronary artery disease Mother        Cabd   Diabetes Mother    Heart attack Father        died 2    Review of Systems:  As stated in the HPI and otherwise negative.   BP 112/70   Pulse (!) 54   Ht 5\' 11"  (1.803 m)   Wt 208 lb 6.4 oz (94.5 kg)   SpO2 98%   BMI 29.07 kg/m   Physical Examination: General: Well developed, well nourished, NAD  HEENT: OP clear, mucus membranes moist  SKIN: warm, dry. No rashes. Neuro: No focal deficits  Musculoskeletal: Muscle strength 5/5 all ext  Psychiatric: Mood and affect normal  Neck: No JVD, no carotid bruits, no thyromegaly, no lymphadenopathy.  Lungs:Clear  bilaterally, no wheezes, rhonci, crackles Cardiovascular: Regular rate and rhythm. No murmurs, gallops or rubs. Abdomen:Soft. Bowel sounds present. Non-tender.  Extremities: No lower extremity edema. Pulses are 2 + in the bilateral DP/PT.  Echo January 2019: - Left ventricle: The inferoapical region of the LV is aneursymal   with significant swirling of definity contrast in the apex which   is consistent with sluggish blood flow and cannot rule out early   forming thrombus. The cavity size was normal. Systolic function   was mildly reduced. The estimated ejection fraction was in  the   range of 45% to 50%. There is akinesis of the apical myocardium.   There is akinesis of the apical lateral, septal, inferolateral,   and inferior myocardium. There is severe hypokinesis of the   apicalanterior myocardium. There was an increased relative   contribution of atrial contraction to ventricular filling.   Doppler parameters are consistent with abnormal left ventricular   relaxation (grade 1 diastolic dysfunction). - Left atrium: The atrium was mildly dilated.   EKG:  EKG is ordered today. The ekg ordered today demonstrates Sinus brady, rate 54 bpm.   Recent Labs: No results found for requested labs within last 8760 hours.   Lipid Panel Lipid Panel     Component Value Date/Time   CHOL 153 02/16/2019 0919   TRIG 183 (H) 02/16/2019 0919   HDL 35 (L) 02/16/2019 0919   CHOLHDL 4.4 02/16/2019 0919   CHOLHDL 4.5 10/19/2015 0941   VLDL 50 (H) 10/19/2015 0941   LDLCALC 87 02/16/2019 0919   LDLDIRECT 113.4 01/07/2011 1021     Wt Readings from Last 3 Encounters:  06/24/21 208 lb 6.4 oz (94.5 kg)  04/09/20 223 lb (101.2 kg)  04/09/20 223 lb 3.2 oz (101.2 kg)    Assessment and Plan:   1. CAD without angina: No chest pain suggestive of angina. Continue ASA, statin and beta blocker. .    2. Ischemic Cardiomyopathy: ICD in place and followed by Dr. Ladona Ridgel. LVEF=45-50% by echo January 2019 with  apical akinesis.  Will continue beta blocker and Ace-inh  3. MURAL THROMBUS, APEX OF LV: Sluggish flow seen in apex on echo January 2019. Continue Xarelto for prevention of LV thrombus given apical akinesis.    4. HTN: BP is controlled. Continue current therapy  5. HYPERLIPIDEMIA:  LDL 89 in February 2023 in primary care. Goal LDL under 70.  Will change Lipitor to Crestor 40 mg daily. He will need repeat lipids and LFTs in 12 weeks.   Current medicines are reviewed at length with the patient today.  The patient does not have concerns regarding medicines.  The following changes have been made:  no change  Labs/ tests ordered today include:   Orders Placed This Encounter  Procedures   EKG 12-Lead    Disposition:   F/U with me in 12  months  Signed, Verne Carrow, MD 06/24/2021 12:09 PM    [facility] [address] [place], [place], [place]  [address] Phone: [phone]; Fax: [phone]

## 2021-06-24 NOTE — Progress Notes (Signed)
HPI [name]. Jeremy Boyle returns today for ongoing evaluation and management of his ICD and chronic systolic heart failure. He underwent ICD generator change out over 3 years ago. He initially had pain after his initial ICD implant but has had none since. He denies chest pain or sob. No edema. No ICD therapies. [place] is living [place] on [place].  No Known Allergies   Current Outpatient Medications  Medication Sig Dispense Refill   acetaminophen (TYLENOL) 500 MG tablet Take 500 mg by mouth every 6 (six) hours as needed (pain.).      aspirin EC 81 MG tablet Take 1 tablet (81 mg total) by mouth daily. 90 tablet 3   ibuprofen (ADVIL) 200 MG tablet Take 1 tablet by mouth as needed.     lisinopril (ZESTRIL) 10 MG tablet Take 1 tablet (10 mg total) by mouth daily. 90 tablet 3   metoprolol tartrate (LOPRESSOR) 50 MG tablet Take 1 tablet (50 mg total) by mouth 2 (two) times daily. 180 tablet 0   nitroGLYCERIN (NITROSTAT) 0.4 MG SL tablet Place 1 tablet (0.4 mg total) under the tongue every 5 (five) minutes as needed for chest pain (MAX 3 TABLETS). 25 tablet 3   omeprazole (PRILOSEC) 40 MG capsule Take 40 mg by mouth as needed.     Polyethyl Glycol-Propyl Glycol 0.4-0.3 % SOLN Place 1 drop into both eyes 3 (three) times daily as needed (dry/irritated eyes.).     rivaroxaban (XARELTO) 20 MG TABS tablet TAKE ONE TABLET BY MOUTH EVERY DAY WITH SUPPER 90 tablet 1   rosuvastatin (CRESTOR) 40 MG tablet Take 1 tablet (40 mg total) by mouth daily. 90 tablet 3   No current facility-administered medications for this visit.     Past Medical History:  Diagnosis Date   Acute myocardial infarction of other anterior wall, episode of care unspecified    Atrial fibrillation (HCC)    Automatic implantable cardioverter-defibrillator in situ 01/01/2010   Qualifier: Diagnosis of  By: Ladona Ridgel, MD, Jerrell Mylar    CARDIOMYOPATHY, ISCHEMIC 02/06/2009   Qualifier: Diagnosis of  By: Valinda Party RN, Nancy      Cardiomyopathy, ischemic 07/27/2018   Chronic systolic heart failure (HCC) 03/15/2018   Coronary atherosclerosis of native coronary artery    diagnosed 12/10    Failure of implantable cardioverter-defibrillator (ICD) lead 03/15/2018   HTN (hypertension) 01/06/2011   HYPERLIPIDEMIA 02/06/2009   Qualifier: Diagnosis of  By: Valinda Party RN, Harriett Sine     ICD (implantable cardiac defibrillator) in place    Implantable cardioverter-defibrillator (ICD) generator end of life 03/15/2018   Ischemic cardiomyopathy    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   Past Surgical History:  Procedure Laterality Date   BACK SURGERY     CARDIAC DEFIBRILLATOR PLACEMENT     St Jude   ICD GENERATOR CHANGEOUT N/A 03/15/2018   Procedure: ICD GENERATOR CHANGEOUT;  Surgeon: Marinus Maw, MD;  Location: [facility] INVASIVE CV LAB;  Service: Cardiovascular;  Laterality: N/A;   KNEE SURGERY     LEAD INSERTION N/A 03/15/2018   Procedure: LEAD INSERTION;  Surgeon: Marinus Maw, MD;  Location: [facility] INVASIVE CV LAB;  Service: Cardiovascular;  Laterality: N/A;   SHOULDER SURGERY     VASECTOMY  Family History  Problem Relation Age of Onset   Coronary artery disease Mother        Cabd   Diabetes Mother    Heart attack Father        died 32     Social History   Socioeconomic History   Marital status: Married    Spouse name: Not on file   Number of children: 2   Years of education: Not on file   Highest education level: Not on file  Occupational History   Not on file  Tobacco Use   Smoking status: Former    Types: Cigarettes    Quit date: 01/03/2009    Years since quitting: 12.4   Smokeless tobacco: Never  Vaping Use   Vaping Use: Never used  Substance and Sexual Activity   Alcohol use: No   Drug use: No   Sexual  activity: Not on file  Other Topics Concern   Not on file  Social History Narrative   Not on file   Social Determinants of Health   Financial Resource Strain: Not on file  Food Insecurity: Not on file  Transportation Needs: Not on file  Physical Activity: Not on file  Stress: Not on file  Social Connections: Not on file  Intimate Partner Violence: Not on file     BP 112/70   Pulse (!) 54   Ht 5\' 11"  (1.803 m)   Wt 208 lb (94.3 kg)   SpO2 98%   BMI 29.01 kg/m   Physical Exam:  Well appearing NAD HEENT: Unremarkable Neck:  No JVD, no thyromegally Lymphatics:  No adenopathy Back:  No CVA tenderness Lungs:  Clear with no wheezes HEART:  Regular rate rhythm, no murmurs, no rubs, no clicks Abd:  soft, positive bowel sounds, no organomegally, no rebound, no guarding Ext:  2 plus pulses, no edema, no cyanosis, no clubbing Skin:  No rashes no nodules Neuro:  CN II through XII intact, motor grossly intact  EKG - nsr  DEVICE  Normal device function.  See PaceArt for details.   Assess/Plan:  1. Chronic systolic heart failure - his symptoms remain class 2. He will continue his current meds. 2. ICD - his St. Jude device was revised over 3 years ago. His lead is working normally. 3. CAD - he is s/p MI. He denies anginal symptoms. We will follow. 4. Dyslipidemia - his LDL is not at goal. He will increase his statin under the direction of Dr. .D.

## 2021-06-24 NOTE — Patient Instructions (Signed)
Medication Instructions:  Your physician recommends that you continue on your current medications as directed. Please refer to the Current Medication list given to you today. *If you need a refill on your cardiac medications before your next appointment, please call your pharmacy*  Lab Work: None. If you have labs (blood work) drawn today and your tests are completely normal, you will receive your results only by: MyChart Message (if you have MyChart) OR A paper copy in the mail If you have any lab test that is abnormal or we need to change your treatment, we will call you to review the results.  Testing/Procedures: None.  Follow-Up: At [facility], you and your health needs are our priority.  As part of our continuing mission to provide you with exceptional heart care, we have created designated Provider Care Teams.  These Care Teams include your primary Cardiologist (physician) and Advanced Practice Providers (APPs -  Physician Assistants and Nurse Practitioners) who all work together to provide you with the care you need, when you need it.  Your physician wants you to follow-up in: 12 months with Gregg Taylor, MD or one of the following Advanced Practice Providers on your designated Care Team:    Renee Ursuy, PA-C Michael "Andy" Tillery, PA-C   You will receive a reminder letter in the mail two months in advance. If you don't receive a letter, please call our office to schedule the follow-up appointment.  We recommend signing up for the patient portal called "MyChart".  Sign up information is provided on this After Visit Summary.  MyChart is used to connect with patients for Virtual Visits (Telemedicine).  Patients are able to view lab/test results, encounter notes, upcoming appointments, etc.  Non-urgent messages can be sent to your provider as well.   To learn more about what you can do with MyChart, go to [email or web address].    Any Other Special Instructions Will Be Listed  Below (If Applicable).         

## 2021-07-05 ENCOUNTER — Ambulatory Visit (INDEPENDENT_AMBULATORY_CARE_PROVIDER_SITE_OTHER): Payer: Medicare HMO

## 2021-07-05 DIAGNOSIS — I255 Ischemic cardiomyopathy: Secondary | ICD-10-CM | POA: Diagnosis not present

## 2021-07-05 LAB — CUP PACEART REMOTE DEVICE CHECK
Battery Remaining Longevity: 68 mo
Battery Remaining Percentage: 66 %
Battery Voltage: 2.98 V
Brady Statistic RV Percent Paced: 1 %
Date Time Interrogation Session: 20230602020017
HighPow Impedance: 55 Ohm
HighPow Impedance: 55 Ohm
Implantable Lead Implant Date: 20200207
Implantable Lead Location: 753860
Implantable Lead Model: 7121
Implantable Pulse Generator Implant Date: 20200207
Lead Channel Impedance Value: 490 Ohm
Lead Channel Pacing Threshold Amplitude: 1 V
Lead Channel Pacing Threshold Pulse Width: 0.5 ms
Lead Channel Sensing Intrinsic Amplitude: 9.1 mV
Lead Channel Setting Pacing Amplitude: 2.5 V
Lead Channel Setting Pacing Pulse Width: 0.5 ms
Lead Channel Setting Sensing Sensitivity: 0.5 mV
Pulse Gen Serial Number: 9793547

## 2021-07-12 NOTE — Progress Notes (Signed)
Remote ICD transmission.   

## 2021-08-09 ENCOUNTER — Other Ambulatory Visit: Payer: Self-pay | Admitting: Cardiovascular Disease

## 2021-10-04 ENCOUNTER — Ambulatory Visit (INDEPENDENT_AMBULATORY_CARE_PROVIDER_SITE_OTHER): Payer: Medicare HMO

## 2021-10-04 DIAGNOSIS — I255 Ischemic cardiomyopathy: Secondary | ICD-10-CM | POA: Diagnosis not present

## 2021-10-07 LAB — CUP PACEART REMOTE DEVICE CHECK
Battery Remaining Longevity: 67 mo
Battery Remaining Percentage: 64 %
Battery Voltage: 2.98 V
Brady Statistic RV Percent Paced: 1 %
Date Time Interrogation Session: 20230901022421
HighPow Impedance: 54 Ohm
HighPow Impedance: 54 Ohm
Implantable Lead Implant Date: 20200207
Implantable Lead Location: 753860
Implantable Lead Model: 7121
Implantable Pulse Generator Implant Date: 20200207
Lead Channel Impedance Value: 530 Ohm
Lead Channel Pacing Threshold Amplitude: 1 V
Lead Channel Pacing Threshold Pulse Width: 0.5 ms
Lead Channel Sensing Intrinsic Amplitude: 7.8 mV
Lead Channel Setting Pacing Amplitude: 2.5 V
Lead Channel Setting Pacing Pulse Width: 0.5 ms
Lead Channel Setting Sensing Sensitivity: 0.5 mV
Pulse Gen Serial Number: 9793547

## 2021-10-23 NOTE — Progress Notes (Signed)
Remote ICD transmission.   

## 2021-11-07 ENCOUNTER — Other Ambulatory Visit: Payer: Self-pay

## 2021-11-07 DIAGNOSIS — I480 Paroxysmal atrial fibrillation: Secondary | ICD-10-CM

## 2021-11-07 MED ORDER — RIVAROXABAN 20 MG PO TABS: ORAL_TABLET | ORAL | 1 refills | Status: AC

## 2021-11-07 NOTE — Telephone Encounter (Signed)
Prescription refill request for Xarelto received.  Indication:Afib Last office visit:5/23 Weight:94.3 kg Age:66 Scr:0.8 CrCl:121.15 ml/min  Prescription refilled

## 2022-01-03 ENCOUNTER — Ambulatory Visit (INDEPENDENT_AMBULATORY_CARE_PROVIDER_SITE_OTHER): Payer: Medicare HMO

## 2022-01-03 DIAGNOSIS — I255 Ischemic cardiomyopathy: Secondary | ICD-10-CM

## 2022-01-06 LAB — CUP PACEART REMOTE DEVICE CHECK
Date Time Interrogation Session: 20231201103709
Implantable Lead Connection Status: 753985
Implantable Lead Implant Date: 20200207
Implantable Lead Location: 753860
Implantable Lead Model: 7121
Implantable Pulse Generator Implant Date: 20200207
Pulse Gen Serial Number: 9793547

## 2022-01-21 NOTE — Progress Notes (Signed)
Remote ICD transmission.   

## 2022-05-04 ENCOUNTER — Other Ambulatory Visit: Payer: Self-pay | Admitting: Cardiovascular Disease

## 2022-05-04 DIAGNOSIS — I480 Paroxysmal atrial fibrillation: Secondary | ICD-10-CM

## 2022-05-05 NOTE — Telephone Encounter (Addendum)
Xarelto 20mg  refill request received. Pt is 67 years old, weight-94.3kg, Crea-0.88 on 03/13/21 via [facility] from [place], last seen by Dr Lovena Le on 06/24/21, Diagnosis-Afib, CrCl- 108.65 mL/min; Dose is appropriate based on dosing criteria.   Pt needs updated labs. Also, need to call pt to see if he has new Cardiologist that he is following up with.   Spoke with pt and he states he states he has a Manufacturing engineer with [facility]-Dr. Lissa Merlin. He states he has more than enough meds and unfortunate that he had to leave his great doctors here but the 4 hours was too much.  Will call the pharmacy when they open at [place] and update them to change cardiology meds to new Cardiologist.  At 913am spoke with Pharmacy and advised to send prescription request to Josem Kaufmann with [facility] in [facility]. He verbalized understanding and stated he would change it.

## 2022-06-07 ENCOUNTER — Other Ambulatory Visit: Payer: Self-pay | Admitting: Cardiovascular Disease

## 2022-06-10 ENCOUNTER — Telehealth: Payer: Self-pay | Admitting: Cardiovascular Disease

## 2022-06-10 NOTE — Telephone Encounter (Signed)
Patient called to report he has moved out of the area.

## 2022-07-18 LAB — COLOGUARD: COLOGUARD: NEGATIVE

## 2022-07-30 ENCOUNTER — Other Ambulatory Visit: Payer: Self-pay

## 2022-07-30 MED ORDER — METOPROLOL TARTRATE 50 MG PO TABS: ORAL_TABLET | ORAL | 0 refills | Status: DC

## 2022-08-27 ENCOUNTER — Other Ambulatory Visit: Payer: Self-pay | Admitting: Cardiovascular Disease
